# Patient Record
Sex: Male | Born: 1975 | Hispanic: No | Marital: Married | Smoking: Current some day smoker
Health system: Southern US, Community
[De-identification: ages and names within clinical notes are randomized; demographics above are authoritative.]

## PROBLEM LIST (undated history)

## (undated) DIAGNOSIS — J309 Allergic rhinitis, unspecified: Secondary | ICD-10-CM

## (undated) DIAGNOSIS — J3089 Other allergic rhinitis: Secondary | ICD-10-CM

## (undated) DIAGNOSIS — E785 Hyperlipidemia, unspecified: Secondary | ICD-10-CM

## (undated) HISTORY — DX: Hyperlipidemia, unspecified: E78.5

## (undated) HISTORY — PX: NO PAST SURGERIES: SHX2092

## (undated) HISTORY — DX: Other allergic rhinitis: J30.89

## (undated) HISTORY — DX: Allergic rhinitis, unspecified: J30.9

---

## 2001-08-23 ENCOUNTER — Encounter: Payer: Self-pay | Admitting: Family Medicine

## 2001-08-23 ENCOUNTER — Ambulatory Visit (HOSPITAL_COMMUNITY): Admission: RE | Admit: 2001-08-23 | Discharge: 2001-08-23 | Payer: Self-pay | Admitting: Family Medicine

## 2015-05-21 ENCOUNTER — Ambulatory Visit (INDEPENDENT_AMBULATORY_CARE_PROVIDER_SITE_OTHER): Payer: Self-pay | Admitting: Allergy and Immunology

## 2015-05-21 ENCOUNTER — Encounter: Payer: Self-pay | Admitting: Allergy and Immunology

## 2015-05-21 VITALS — BP 118/80 | HR 80 | Resp 16

## 2015-05-21 DIAGNOSIS — L255 Unspecified contact dermatitis due to plants, except food: Secondary | ICD-10-CM

## 2015-05-21 DIAGNOSIS — J309 Allergic rhinitis, unspecified: Secondary | ICD-10-CM

## 2015-05-21 DIAGNOSIS — H1010 Acute atopic conjunctivitis, unspecified eye: Secondary | ICD-10-CM | POA: Insufficient documentation

## 2015-05-21 MED ORDER — HALOBETASOL PROPIONATE 0.05 % EX CREA: TOPICAL_CREAM | Freq: Two times a day (BID) | CUTANEOUS | Status: DC

## 2015-05-21 MED ORDER — METHYLPREDNISOLONE ACETATE 80 MG/ML IJ SUSP
80.0000 mg | Freq: Once | INTRAMUSCULAR | Status: AC
  Administered 2015-05-21: 80 mg via INTRAMUSCULAR

## 2015-05-21 MED ORDER — MONTELUKAST SODIUM 10 MG PO TABS: 10.0000 mg | ORAL_TABLET | Freq: Every day | ORAL | Status: DC

## 2015-05-21 NOTE — Progress Notes (Signed)
[facility] [place]  Follow-up Note    Subjective:   Timothy MannanJuan Russell is a 39 y.o. male who returns to the [facility] in re-evaluation of the following:  HPI Comments: Timothy Russell returns to this clinic noting that his allergies are under excellent control with his current medical therapy which includes a combination of over-the-counter nasal steroid, montelukast, and over-the-counter antihistamine. She uses this plan during the spring and fall. He is very pleased with the response he receives with this plan.  Timothy Russell has developed a very significant dermatitis on his body. This apparently happened after he spent time cleaning up Timothy Russell and using a chipper. His infected his left arm significantly and the rest of his body to some degree. It is intensely itchy. It is oozing. He has no associated systemic or constitutional symptoms. There has been no fever. He has tried some over-the-counter remedies which have not worked.    Current outpatient prescriptions:  .  diphenhydrAMINE (BENADRYL) 2 % cream, Apply topically at bedtime as needed for itching., Disp: , Rfl:  .  cetirizine (ZYRTEC) 10 MG tablet, Take 10 mg by mouth daily., Disp: , Rfl:  .  Ketotifen Fumarate (ALAWAY OP), Apply to eye., Disp: , Rfl:  .  montelukast (SINGULAIR) 10 MG tablet, Take 10 mg by mouth at bedtime., Disp: , Rfl:  .  Triamcinolone Acetonide (NASACORT AQ NA), Place 2 sprays into the nose daily., Disp: , Rfl:   Current facility-administered medications:  .  methylPREDNISolone acetate (DEPO-MEDROL) injection 80 mg, 80 mg, Intramuscular, Once, Jessica PriestEric J Bransen Fassnacht, MD  Meds ordered this encounter  Medications  . methylPREDNISolone acetate (DEPO-MEDROL) injection 80 mg    Sig:     No past medical history on file.  Past Surgical History  Procedure Laterality Date  . No past surgeries      No Known Allergies  Review of Systems  Constitutional: Negative for  fever and chills.  HENT: Negative for congestion, ear discharge, ear pain, hearing loss, nosebleeds, sore throat and tinnitus.   Eyes: Negative for blurred vision, pain, discharge and redness.  Respiratory: Negative for cough, hemoptysis, sputum production, shortness of breath, wheezing and stridor.   Gastrointestinal: Negative for heartburn, nausea, vomiting and abdominal pain.  Musculoskeletal: Negative for myalgias and joint pain.  Skin: Positive for itching and rash.  Neurological: Negative for headaches.     Objective:   Filed Vitals:   05/21/15 1007  BP: 118/80  Pulse: 80  Resp: 16    Physical Exam  Constitutional: He is well-developed, well-nourished, and in no distress. No distress.  HENT:  Head: Normocephalic and atraumatic. Head is without right periorbital erythema and without left periorbital erythema.  Right Ear: Tympanic membrane, external ear and ear canal normal. No drainage. No foreign bodies. Tympanic membrane is not injected, not scarred, not perforated, not erythematous, not retracted and not bulging. No middle ear effusion.  Left Ear: Tympanic membrane, external ear and ear canal normal. No drainage. No foreign bodies. Tympanic membrane is not injected, not scarred, not perforated, not erythematous, not retracted and not bulging.  No middle ear effusion.  Nose: Nose normal. No mucosal edema, rhinorrhea, nose lacerations, sinus tenderness, nasal deformity, septal deviation or nasal septal hematoma. No epistaxis.  Mouth/Throat: Oropharynx is clear and moist and mucous membranes are normal. No oropharyngeal exudate, posterior oropharyngeal edema, posterior oropharyngeal erythema or tonsillar abscesses.  Eyes: Conjunctivae and lids are normal. Pupils are equal, round, and reactive  to light. Right eye exhibits no discharge and no exudate. No foreign body present in the right eye. Left eye exhibits no discharge and no exudate. No foreign body present in the left eye. Right  conjunctiva is not injected. Right conjunctiva has no hemorrhage. Left conjunctiva is not injected. Left conjunctiva has no hemorrhage. No scleral icterus.  Neck: No tracheal tenderness present. No tracheal deviation present. No thyromegaly present.  Cardiovascular: Normal rate, regular rhythm, S1 normal, S2 normal and normal heart sounds.  Exam reveals no gallop and no friction rub.   No murmur heard. Pulmonary/Chest: Effort normal. No stridor. No respiratory distress. He has no wheezes. He has no rhonchi. He has no rales. He exhibits no tenderness.  Musculoskeletal: He exhibits no edema or tenderness.  Lymphadenopathy:    He has no cervical adenopathy.  Skin: Rash noted. No purpura noted. Rash is not macular, not maculopapular, not nodular, not pustular, not vesicular and not urticarial. He is not diaphoretic. There is erythema. No cyanosis. No pallor. Nails show no clubbing.  Diffuse papular vesicular eruption across his abdomen and extremities. He had very severe involvement of his left forearm with very large vesicles. There was some surrounding erythema. The material oozing from the vesicles was clear fluid.  Psychiatric: Mood, affect and judgment normal.    Diagnostics: none     .    Assessment and Plan:   1. Rhus dermatitis   2. Allergic rhinoconjunctivitis        1. Depomedrol 80 mg IM today  2. Prednisone  3 tablets on time a day for 5 days, then 2 tablets one time a day for 5 days, then one tablet one time a day for 5 days.  3. Ultravate cream ointment (which ever is cheaper) apply to affected area twice a day.  4. Use zyrtec  one time a day for itchiness  5. Use OTC Rhinocort one spray each nostril one time per day during Spring / fall (coupon)  6. Continue Montelukast  one tablet one time per day during spring / fall.  7. When better, get a flu vaccine.  8. Return in 1 year or earlier if problem.  I will assume that Dock will do well with a for  mentioned therapy and he will follow up in this clinic 3 does not do well. We will keep him on a prolonged prednisone taper in the hope of averting a id reaction. Otherwise, he will continue therapy for his allergic rhinoconjunctivitis and I will see him back in this clinic in 1 year or earlier if a problem    Laurette Schimke, MD [facility]

## 2015-05-21 NOTE — Patient Instructions (Signed)
  1. Depomedrol 80 mg IM today  2. Prednisone 10mg  3 tablets on time a day for 5 days, then 2 tablets one time a day for 5 days, then one tablet one time a day for 5 days.  3. Ultravate cream ointment (which ever is cheaper) apply to affected area twice a day.  4. Use zyrtec 10mg  one time a day for itchiness  5. Use OTC Rhinocort one spray each nostril one time per day during Spring / fall (coupon)  6. Continue Montelukast 10mg  one tablet one time per day during spring / fall.  7. When better, get a flu vaccine.  8. Return in 1 year or earlier if problem.

## 2016-06-19 ENCOUNTER — Other Ambulatory Visit: Payer: Self-pay | Admitting: Allergy and Immunology

## 2016-07-24 ENCOUNTER — Other Ambulatory Visit: Payer: Self-pay | Admitting: Allergy and Immunology

## 2016-08-01 ENCOUNTER — Other Ambulatory Visit: Payer: Self-pay | Admitting: Allergy and Immunology

## 2016-11-30 ENCOUNTER — Ambulatory Visit (INDEPENDENT_AMBULATORY_CARE_PROVIDER_SITE_OTHER): Payer: Self-pay | Admitting: Allergy and Immunology

## 2016-11-30 ENCOUNTER — Encounter: Payer: Self-pay | Admitting: Allergy and Immunology

## 2016-11-30 VITALS — BP 110/70 | HR 60 | Resp 18

## 2016-11-30 DIAGNOSIS — J3089 Other allergic rhinitis: Secondary | ICD-10-CM

## 2016-11-30 DIAGNOSIS — H1045 Other chronic allergic conjunctivitis: Secondary | ICD-10-CM

## 2016-11-30 DIAGNOSIS — H1010 Acute atopic conjunctivitis, unspecified eye: Secondary | ICD-10-CM

## 2016-11-30 MED ORDER — MONTELUKAST SODIUM 10 MG PO TABS: 10.0000 mg | ORAL_TABLET | Freq: Every day | ORAL | 11 refills | Status: DC

## 2016-11-30 MED ORDER — METHYLPREDNISOLONE ACETATE 80 MG/ML IJ SUSP
80.0000 mg | Freq: Once | INTRAMUSCULAR | Status: AC
  Administered 2016-11-30: 80 mg via INTRAMUSCULAR

## 2016-11-30 NOTE — Progress Notes (Signed)
Follow-up Note  Referring Provider: No ref. provider found Primary Provider: Sandre Kitty, PA-C Date of Office Visit: 11/30/2016  Subjective:   Timothy Russell (DOB: 1976-05-27) is a 41 y.o. male who returns to the [facility] on 11/30/2016 in re-evaluation of the following:  HPI: Timothy Russell returns to this clinic in reevaluation of his allergic rhinoconjunctivitis. I last saw him in this clinic October 2016.  He does relatively well while consistently using his medical therapy which at this point in time include some form of nasal steroid and a leukotriene modifier and a antihistamine but since the spring has arrived he has had more problems with nasal congestion and sneezing and itchy red watery eyes even in the face of utilizing his medications.  Overall he is very satisfied with the response that he receives with medical treatment other than what occurs during the spring time.  Allergies as of 11/30/2016   No Known Allergies     Medication List      ALAWAY OP Apply to eye.   cetirizine 10 MG tablet Commonly known as:  ZYRTEC Take 10 mg by mouth daily.   FISH OIL PO Take by mouth.   montelukast 10 MG tablet Commonly known as:  SINGULAIR Take 1 tablet (10 mg total) by mouth at bedtime.   NASACORT AQ NA Place 2 sprays into the nose daily.   omeprazole 20 MG capsule Commonly known as:  PRILOSEC TAKE 1 CAPSULE(S) EVERY DAY BY ORAL ROUTE IN THE MORNING.   VITAMIN C PO Take by mouth.       Past Medical History:  Diagnosis Date  . Allergic rhinitis     Past Surgical History:  Procedure Laterality Date  . NO PAST SURGERIES      Review of systems negative except as noted in HPI / PMHx or noted below:  Review of Systems  Constitutional: Negative.   HENT: Negative.   Eyes: Negative.   Respiratory: Negative.   Cardiovascular: Negative.   Gastrointestinal: Negative.   Genitourinary: Negative.   Musculoskeletal: Negative.   Skin:  Negative.   Neurological: Negative.   Endo/Heme/Allergies: Negative.   Psychiatric/Behavioral: Negative.      Objective:   Vitals:   11/30/16 1052  BP: 110/70  Pulse: 60  Resp: 18          Physical Exam  Constitutional: He is well-developed, well-nourished, and in no distress.  Allergic shiners  HENT:  Head: Normocephalic.  Right Ear: Tympanic membrane, external ear and ear canal normal.  Left Ear: Tympanic membrane, external ear and ear canal normal.  Nose: Mucosal edema present. No rhinorrhea.  Mouth/Throat: Uvula is midline, oropharynx is clear and moist and mucous membranes are normal. No oropharyngeal exudate.  Eyes: Right conjunctiva is injected. Left conjunctiva is injected.  Neck: Trachea normal. No tracheal tenderness present. No tracheal deviation present. No thyromegaly present.  Cardiovascular: Normal rate, regular rhythm, S1 normal, S2 normal and normal heart sounds.   No murmur heard. Pulmonary/Chest: Breath sounds normal. No stridor. No respiratory distress. He has no wheezes. He has no rales.  Musculoskeletal: He exhibits no edema.  Lymphadenopathy:       Head (right side): No tonsillar adenopathy present.       Head (left side): No tonsillar adenopathy present.    He has no cervical adenopathy.  Neurological: He is alert. Gait normal.  Skin: No rash noted. He is not diaphoretic. No erythema. Nails show no clubbing.  Psychiatric: Mood and affect  normal.    Diagnostics: none   Assessment and Plan:   1. Other allergic rhinitis   2. Seasonal allergic conjunctivitis     1. Depomedrol 80 mg IM today  2. Use zyrtec  one time a day    3. Use OTC Rhinocort / Nasacort one spray each nostril one time per day during Spring / fall    4. Continue Montelukast  one tablet one time per day during spring / fall.  5. Return in 1 year or earlier if problem.  I have given Timothy Russell a systemic steroid today to help with his springtime flare of his allergic  rhinoconjunctivitis and he will continue to use medical therapy directed against respiratory tract and conjunctival inflammation as noted above and I will assume that he will do well with this plan and see him back in this clinic in 1 year or earlier if there is a problem.  Laurette Schimke, MD Allergy / Immunology [facility]

## 2016-11-30 NOTE — Patient Instructions (Signed)
  1. Depomedrol 80 mg IM today  2. Use zyrtec  one time a day    3. Use OTC Rhinocort / Nasacort one spray each nostril one time per day during Spring / fall    4. Continue Montelukast  one tablet one time per day during spring / fall.  5. Return in 1 year or earlier if problem.

## 2017-09-22 ENCOUNTER — Other Ambulatory Visit: Payer: Self-pay | Admitting: Allergy and Immunology

## 2017-12-15 ENCOUNTER — Other Ambulatory Visit: Payer: Self-pay | Admitting: Allergy and Immunology

## 2018-10-31 ENCOUNTER — Other Ambulatory Visit: Payer: Self-pay

## 2018-10-31 ENCOUNTER — Encounter: Payer: Self-pay | Admitting: Allergy and Immunology

## 2018-10-31 ENCOUNTER — Ambulatory Visit (INDEPENDENT_AMBULATORY_CARE_PROVIDER_SITE_OTHER): Payer: Self-pay | Admitting: Allergy and Immunology

## 2018-10-31 VITALS — BP 120/70 | HR 76 | Resp 16 | Ht 69.0 in | Wt 186.0 lb

## 2018-10-31 DIAGNOSIS — J3089 Other allergic rhinitis: Secondary | ICD-10-CM

## 2018-10-31 DIAGNOSIS — H1010 Acute atopic conjunctivitis, unspecified eye: Secondary | ICD-10-CM

## 2018-10-31 MED ORDER — MONTELUKAST SODIUM 10 MG PO TABS: ORAL_TABLET | ORAL | 11 refills | Status: DC

## 2018-10-31 MED ORDER — METHYLPREDNISOLONE ACETATE 80 MG/ML IJ SUSP
80.0000 mg | Freq: Once | INTRAMUSCULAR | Status: AC
  Administered 2018-10-31: 80 mg via INTRAMUSCULAR

## 2018-10-31 NOTE — Patient Instructions (Addendum)
  1. Depomedrol 80 mg IM today  2. Continue Montelukast 10mg  one tablet one time per day   3. Use zyrtec 10mg  one time a day if needed  4. Use OTC Rhinocort / Nasacort one spray each nostril one time if needed  5. Use Alaway eyedrops 1 drop each eye twice a day if needed  6. Return in 1 year or earlier if problem.

## 2018-10-31 NOTE — Progress Notes (Signed)
Tim[facility] Russell - [place] [facility] [place] [facility] [place] [facility] [place]   Follow-up Note  Referring Provider: Marya Russell Primary Provider: Marya Russell Date of Office Visit: 10/31/2018  Subjective:   Timothy Russell (DOB: 16-Jun-1976) is a 43 y.o. male who returns to the [facility] on 10/31/2018 in re-evaluation of the following:  HPI: Timothy Russell returns to this clinic in reevaluation of allergic rhinoconjunctivitis.  His last visit to this clinic was 30 Nov 2016.  Over the course of the past week if not 2 he has had lots of problems with itchy red watery eyes and nasal congestion and sneezing even in the face of utilizing his montelukast and Zyrtec and a nasal steroid.  He has not had any fever or ugly nasal discharge or other respiratory tract symptoms.  Allergies as of 10/31/2018   No Known Allergies     Medication List      ALAWAY OP Apply to eye.   cetirizine 10 MG tablet Commonly known as:  ZYRTEC Take 10 mg by mouth daily.   montelukast 10 MG tablet Commonly known as:  SINGULAIR TAKE 1 TABLET BY MOUTH EVERYDAY AT BEDTIME   NASACORT AQ NA Place 2 sprays into the nose daily.   VITAMIN C PO Take by mouth.       Past Medical History:  Diagnosis Date  . Allergic rhinitis     Past Surgical History:  Procedure Laterality Date  . NO PAST SURGERIES      Review of systems negative except as noted in HPI / PMHx or noted below:  Review of Systems  Constitutional: Negative.   HENT: Negative.   Eyes: Negative.   Respiratory: Negative.   Cardiovascular: Negative.   Gastrointestinal: Negative.   Genitourinary: Negative.   Musculoskeletal: Negative.   Skin: Negative.   Neurological: Negative.   Endo/Heme/Allergies: Negative.   Psychiatric/Behavioral: Negative.      Objective:   Vitals:   10/31/18 1552  BP: 120/70  Pulse: 76  Resp: 16   Height: 5\' 9"  (175.3 cm)  Weight: 186 lb (84.4 kg)   Physical Exam Constitutional:       Appearance: He is not diaphoretic.     Comments: Allergic shiners  HENT:     Head: Normocephalic.     Right Ear: Tympanic membrane, ear canal and external ear normal.     Left Ear: Tympanic membrane, ear canal and external ear normal.     Nose: Mucosal edema (Erythematous) present. No rhinorrhea.     Mouth/Throat:     Pharynx: Uvula midline. No oropharyngeal exudate.  Eyes:     Conjunctiva/sclera:     Right eye: Right conjunctiva is injected.     Left eye: Left conjunctiva is injected.  Neck:     Thyroid: No thyromegaly.     Trachea: Trachea normal. No tracheal tenderness or tracheal deviation.  Cardiovascular:     Rate and Rhythm: Normal rate and regular rhythm.     Heart sounds: Normal heart sounds, S1 normal and S2 normal. No murmur.  Pulmonary:     Effort: No respiratory distress.     Breath sounds: Normal breath sounds. No stridor. No wheezing or rales.  Lymphadenopathy:     Head:     Right side of head: No tonsillar adenopathy.     Left side of head: No tonsillar adenopathy.     Cervical: No cervical adenopathy.  Skin:    Findings: No erythema or rash.  Nails: There is no clubbing.   Neurological:     Mental Status: He is alert.     Diagnostics: none  Assessment and Plan:   1. Other allergic rhinitis   2. Seasonal allergic conjunctivitis     1. Depomedrol 80 mg IM today  2. Continue Montelukast 10mg  one tablet one time per day   3. Use zyrtec 10mg  one time a day if needed  4. Use OTC Rhinocort / Nasacort one spray each nostril one time if needed  5. Use Alaway eyedrops 1 drop each eye twice a day if needed  6. Return in 1 year or earlier if problem.  Delshaun appears to have a very significant flare of his atopic respiratory and conjunctival disease that hopefully will be helped by his current plan and a dose of systemic steroids administered today.  He will keep in contact with me noting his response as he moves forward.  Timothy Schimke, MD Allergy /  Immunology [facility]

## 2018-11-01 ENCOUNTER — Encounter: Payer: Self-pay | Admitting: Allergy and Immunology

## 2019-01-15 ENCOUNTER — Encounter (HOSPITAL_BASED_OUTPATIENT_CLINIC_OR_DEPARTMENT_OTHER): Payer: Self-pay | Admitting: *Deleted

## 2019-01-15 ENCOUNTER — Emergency Department (HOSPITAL_BASED_OUTPATIENT_CLINIC_OR_DEPARTMENT_OTHER)
Admission: EM | Admit: 2019-01-15 | Discharge: 2019-01-15 | Disposition: A | Discharge status: Home / Self Care | Payer: Self-pay | Attending: Emergency Medicine | Admitting: Emergency Medicine

## 2019-01-15 ENCOUNTER — Emergency Department (HOSPITAL_BASED_OUTPATIENT_CLINIC_OR_DEPARTMENT_OTHER): Payer: Self-pay

## 2019-01-15 ENCOUNTER — Other Ambulatory Visit: Payer: Self-pay

## 2019-01-15 DIAGNOSIS — S2232XA Fracture of one rib, left side, initial encounter for closed fracture: Secondary | ICD-10-CM | POA: Insufficient documentation

## 2019-01-15 DIAGNOSIS — W19XXXA Unspecified fall, initial encounter: Secondary | ICD-10-CM

## 2019-01-15 DIAGNOSIS — Z79899 Other long term (current) drug therapy: Secondary | ICD-10-CM | POA: Insufficient documentation

## 2019-01-15 DIAGNOSIS — W01198A Fall on same level from slipping, tripping and stumbling with subsequent striking against other object, initial encounter: Secondary | ICD-10-CM | POA: Insufficient documentation

## 2019-01-15 DIAGNOSIS — Y9289 Other specified places as the place of occurrence of the external cause: Secondary | ICD-10-CM | POA: Insufficient documentation

## 2019-01-15 DIAGNOSIS — F17200 Nicotine dependence, unspecified, uncomplicated: Secondary | ICD-10-CM | POA: Insufficient documentation

## 2019-01-15 DIAGNOSIS — Y93H9 Activity, other involving exterior property and land maintenance, building and construction: Secondary | ICD-10-CM | POA: Insufficient documentation

## 2019-01-15 DIAGNOSIS — Y999 Unspecified external cause status: Secondary | ICD-10-CM | POA: Insufficient documentation

## 2019-01-15 MED ORDER — IBUPROFEN 600 MG PO TABS: 600.0000 mg | ORAL_TABLET | Freq: Three times a day (TID) | ORAL | 0 refills | Status: DC | PRN

## 2019-01-15 NOTE — ED Triage Notes (Signed)
3 days ago his foot went into a hole causing him to fall. Injury to his left ribs.

## 2019-01-15 NOTE — ED Notes (Signed)
ED Provider at bedside. 

## 2019-01-15 NOTE — Discharge Instructions (Signed)
1.  Return to the emergency department if you become short of breath, pain is suddenly worsening, you develop a fever, coughing up blood or mucus.

## 2019-01-15 NOTE — ED Provider Notes (Signed)
[place] EMERGENCY DEPARTMENT Provider Note   CSN: [id number] Arrival date & time: 01/15/19  1417     History   Chief Complaint Chief Complaint  Patient presents with  . Fall  . Rib Injury    HPI Timothy Russell is a 43 y.o. male.     HPI 3 days ago the patient was painting in a squatted position and lost his balance falling on his left side onto a board.  He reports it hurt but he continued with usual activities.  Ports it got painful with twisting, bending, laughing.  No shortness of breath.  No fever no cough.  Abdominal pain.  No blood in the urine.  No other injury.  Today the patient tried Advil and feels better. Past Medical History:  Diagnosis Date  . Allergic rhinitis     Patient Active Problem List   Diagnosis Date Noted  . Allergic rhinoconjunctivitis 05/21/2015    Past Surgical History:  Procedure Laterality Date  . NO PAST SURGERIES          Home Medications    Prior to Admission medications   Medication Sig Start Date End Date Taking? Authorizing Provider  cetirizine (ZYRTEC) 10 MG tablet Take 10 mg by mouth daily.   Yes [provider]  montelukast (SINGULAIR) 10 MG tablet TAKE 1 TABLET BY MOUTH EVERYDAY AT BEDTIME 10/31/18  Yes Kozlow, Donnamarie Poag, MD  Triamcinolone Acetonide (NASACORT AQ NA) Place 2 sprays into the nose daily.   Yes [provider]  Ascorbic Acid (VITAMIN C PO) Take by mouth.    [provider]  ibuprofen (ADVIL) 600 MG tablet Take 1 tablet (600 mg total) by mouth every 8 (eight) hours as needed. 01/15/19   Charlesetta Shanks, MD  Ketotifen Fumarate (ALAWAY OP) Apply to eye.    [provider]    Family History Family History  Problem Relation Age of Onset  . Allergic rhinitis Neg Hx   . Angioedema Neg Hx   . Asthma Neg Hx   . Eczema Neg Hx   . Urticaria Neg Hx   . Immunodeficiency Neg Hx   . Atopy Neg Hx     Social History Social History   Tobacco Use  . Smoking status:  Current Some Day Smoker  . Smokeless tobacco: Never Used  Substance Use Topics  . Alcohol use: Not Currently    Alcohol/week: 0.0 standard drinks  . Drug use: Never     Allergies   Patient has no known allergies.   Review of Systems Review of Systems 10 Systems reviewed and are negative for acute change except as noted in the HPI.   Physical Exam Updated Vital Signs BP 129/90   Pulse 73   Temp 97.8 F (36.6 C) (Oral)   Resp 16   Ht 5\' 7"  (1.702 m)   Wt 86.9 kg   SpO2 99%   BMI 30.01 kg/m   Physical Exam Constitutional:      Appearance: He is well-developed.  HENT:     Head: Normocephalic and atraumatic.  Eyes:     Extraocular Movements: Extraocular movements intact.  Neck:     Musculoskeletal: Neck supple.  Cardiovascular:     Rate and Rhythm: Normal rate and regular rhythm.     Heart sounds: Normal heart sounds.  Pulmonary:     Effort: Pulmonary effort is normal.     Breath sounds: Normal breath sounds.     Comments: Focal chest tenderness at lateral lower chest  wall.  No contusion abrasion or crepitus. Chest:     Chest wall: Tenderness present.  Abdominal:     General: Bowel sounds are normal. There is no distension.     Palpations: Abdomen is soft.     Tenderness: There is no abdominal tenderness.  Musculoskeletal: Normal range of motion.  Skin:    General: Skin is warm and dry.  Neurological:     Mental Status: He is alert and oriented to person, place, and time.     GCS: GCS eye subscore is 4. GCS verbal subscore is 5. GCS motor subscore is 6.     Coordination: Coordination normal.      ED Treatments / Results  Labs (all labs ordered are listed, but only abnormal results are displayed) Labs Reviewed - No data to display  EKG    Radiology Dg Ribs Unilateral W/chest Left  Result Date: 01/15/2019 CLINICAL DATA:  Lower anterior left rib pain. EXAM: LEFT RIBS AND CHEST - 3+ VIEW COMPARISON:  None. FINDINGS: Minimally displaced left lateral  ninth rib fracture. No evidence of pneumothorax. Normal appearance of the heart, and mediastinal contours. Mild left lung base atelectasis. IMPRESSION: Minimally displaced left lateral ninth rib fracture. Electronically Signed   By: Ted Mcalpineobrinka  Dimitrova M.D.   On: 01/15/2019 15:18    Procedures Procedures (including critical care time)  Medications Ordered in ED Medications - No data to display   Initial Impression / Assessment and Plan / ED Course  I have reviewed the triage vital signs and the nursing notes.  Pertinent labs & imaging results that were available during my care of the patient were reviewed by me and considered in my medical decision making (see chart for details).        Patient has isolated rib fracture.  Fall was actually from a low position and falling onto an object.  Do not suspect other associated injury.  Patient has gotten relief at home with Advil.  Management and return precautions reviewed.  Final Clinical Impressions(s) / ED Diagnoses   Final diagnoses:  Closed fracture of one rib of left side, initial encounter    ED Discharge Orders         Ordered    ibuprofen (ADVIL) 600 MG tablet  Every 8 hours PRN     01/15/19 1532           Arby BarrettePfeiffer, Jonn Chaikin, MD 01/15/19 1535

## 2019-01-16 ENCOUNTER — Telehealth: Payer: Self-pay | Admitting: General Practice

## 2019-01-16 NOTE — Telephone Encounter (Signed)
Pt needed a new patient appt for rib pain he's been experiencing since 6/12. Urgent Care was recommended.

## 2019-10-21 ENCOUNTER — Other Ambulatory Visit: Payer: Self-pay | Admitting: Allergy and Immunology

## 2019-10-22 NOTE — Telephone Encounter (Signed)
Courtesy refill  

## 2019-11-15 ENCOUNTER — Other Ambulatory Visit: Payer: Self-pay | Admitting: Allergy and Immunology

## 2019-11-20 ENCOUNTER — Ambulatory Visit (INDEPENDENT_AMBULATORY_CARE_PROVIDER_SITE_OTHER): Payer: Self-pay | Admitting: Allergy and Immunology

## 2019-11-20 ENCOUNTER — Encounter: Payer: Self-pay | Admitting: Allergy and Immunology

## 2019-11-20 ENCOUNTER — Other Ambulatory Visit: Payer: Self-pay

## 2019-11-20 VITALS — BP 126/82 | HR 82 | Temp 98.1°F | Resp 18 | Ht 68.0 in | Wt 193.0 lb

## 2019-11-20 DIAGNOSIS — J3089 Other allergic rhinitis: Secondary | ICD-10-CM

## 2019-11-20 DIAGNOSIS — J301 Allergic rhinitis due to pollen: Secondary | ICD-10-CM

## 2019-11-20 DIAGNOSIS — H1010 Acute atopic conjunctivitis, unspecified eye: Secondary | ICD-10-CM

## 2019-11-20 MED ORDER — METHYLPREDNISOLONE ACETATE 80 MG/ML IJ SUSP
80.0000 mg | Freq: Once | INTRAMUSCULAR | Status: AC
  Administered 2019-11-20: 80 mg via INTRAMUSCULAR

## 2019-11-20 NOTE — Progress Notes (Signed)
[place] - [place] - [place] - [place] - [place]   Follow-up Note  Referring Provider: No ref. provider found Primary Provider: Patient, No Pcp Per Date of Office Visit: 11/20/2019  Subjective:   Timothy Russell (DOB: November 10, 1975) is a 44 y.o. male who [facility] in re-evaluation of the following:  HPI: Timothy Russell returns to this clinic in reevaluation of allergic rhinoconjunctivitis.  I last saw him in this clinic on 31 October 2018.  While consistently using montelukast and Zyrtec and Nasacort his upper airway issue has done relatively well this spring.  But his eyes are totally torn up with redness and itching and he can stop rubbing his eyes even though he uses Alaway eyedrops every day.  This is his usual course of events throughout the spring.  The rest of the year he does very well while consistently using his medications.  He has received 2 Covid vaccinations.  Allergies as of 11/20/2019   No Known Allergies     Medication List      ALAWAY OP Apply to eye.   cetirizine 10 MG tablet Commonly known as: ZYRTEC Take 10 mg by mouth daily.   ibuprofen 600 MG tablet Commonly known as: ADVIL Take 1 tablet (600 mg total) by mouth every 8 (eight) hours as needed.   montelukast 10 MG tablet Commonly known as: SINGULAIR TAKE 1 TABLET BY MOUTH EVERYDAY AT BEDTIME   NASACORT AQ NA Place 2 sprays into the nose daily.   VITAMIN C PO Take by mouth.       Past Medical History:  Diagnosis Date  . Allergic rhinitis     Past Surgical History:  Procedure Laterality Date  . NO PAST SURGERIES      Review of systems negative except as noted in HPI / PMHx or noted below:  Review of Systems  Constitutional: Negative.   HENT: Negative.   Eyes: Negative.   Respiratory: Negative.   Cardiovascular: Negative.   Gastrointestinal: Negative.   Genitourinary: Negative.   Musculoskeletal: Negative.   Skin: Negative.     Neurological: Negative.   Endo/Heme/Allergies: Negative.   Psychiatric/Behavioral: Negative.      Objective:   Vitals:   11/20/19 0855  BP: 126/82  Pulse: 82  Resp: 18  Temp: 98.1 F (36.7 C)  SpO2: 96%   Height: 5\' 8"  (172.7 cm)  Weight: 193 lb (87.5 kg)   Physical Exam Constitutional:      Appearance: He is not diaphoretic.  HENT:     Head: Normocephalic.     Right Ear: Tympanic membrane, ear canal and external ear normal.     Left Ear: Tympanic membrane, ear canal and external ear normal.     Nose: Nose normal. No mucosal edema or rhinorrhea.     Mouth/Throat:     Pharynx: Uvula midline. No oropharyngeal exudate.  Eyes:     Conjunctiva/sclera:     Right eye: Right conjunctiva is injected.     Left eye: Left conjunctiva is injected.  Neck:     Thyroid: No thyromegaly.     Trachea: Trachea normal. No tracheal tenderness or tracheal deviation.  Cardiovascular:     Rate and Rhythm: Normal rate and regular rhythm.     Heart sounds: Normal heart sounds, S1 normal and S2 normal. No murmur.  Pulmonary:     Effort: No respiratory distress.     Breath sounds: Normal breath sounds. No stridor. No wheezing or rales.  Lymphadenopathy:  Head:     Right side of head: No tonsillar adenopathy.     Left side of head: No tonsillar adenopathy.     Cervical: No cervical adenopathy.  Skin:    Findings: No erythema or rash.     Nails: There is no clubbing.  Neurological:     Mental Status: He is alert.     Diagnostics: none  Assessment and Plan:   1. Perennial allergic rhinitis   2. Seasonal allergic rhinitis due to pollen   3. Seasonal allergic conjunctivitis     1. Depomedrol 80 mg IM today  2. Continue Montelukast 10mg  one tablet one time per day   3. Use OTC Zyrtec 10mg  one time a day if needed  4. Use OTC Nasacort one spray each nostril one time if needed  5. Use OTC Pataday 1 drop each eye 1 time a day if needed  6. Return in 1 year or earlier if  problem.  Timothy Russell has had a flareup of his atopic respiratory and conjunctival disease as he progresses through the spring season.  We will treat him with the therapy noted above which includes the use of a systemic steroid.  He will keep in contact with me noting his response to this approach.  Further evaluation and treatment will be based upon his response.  Timothy Katz, MD Allergy / Immunology [place]

## 2019-11-20 NOTE — Patient Instructions (Addendum)
  1. Depomedrol 80 mg IM today  2. Continue Montelukast 10mg one tablet one time per day   3. Use OTC Zyrtec 10mg one time a day if needed  4. Use OTC Nasacort one spray each nostril one time if needed  5. Use OTC Pataday 1 drop each eye 1 time a day if needed  6. Return in 1 year or earlier if problem.   

## 2019-12-10 ENCOUNTER — Other Ambulatory Visit: Payer: Self-pay

## 2019-12-10 ENCOUNTER — Other Ambulatory Visit: Payer: Self-pay | Admitting: Allergy and Immunology

## 2019-12-10 MED ORDER — MONTELUKAST SODIUM 10 MG PO TABS: 10.0000 mg | ORAL_TABLET | Freq: Every day | ORAL | 5 refills | Status: DC

## 2019-12-10 NOTE — Telephone Encounter (Signed)
Montelukast #30 with 5 refills was sent to pharmacy. Patient made aware.

## 2019-12-10 NOTE — Telephone Encounter (Signed)
Patient was seen 11/20/19. His wife called to let us know his Montelukast was never sent in.  [facility].

## 2019-12-25 IMAGING — DX LEFT RIBS AND CHEST - 3+ VIEW
3 series · 3 of 3 positions shown · non-contrast
Comparison: None.

CLINICAL DATA: Lower anterior left rib pain.

EXAM:
LEFT RIBS AND CHEST - 3+ VIEW

[chest pa]
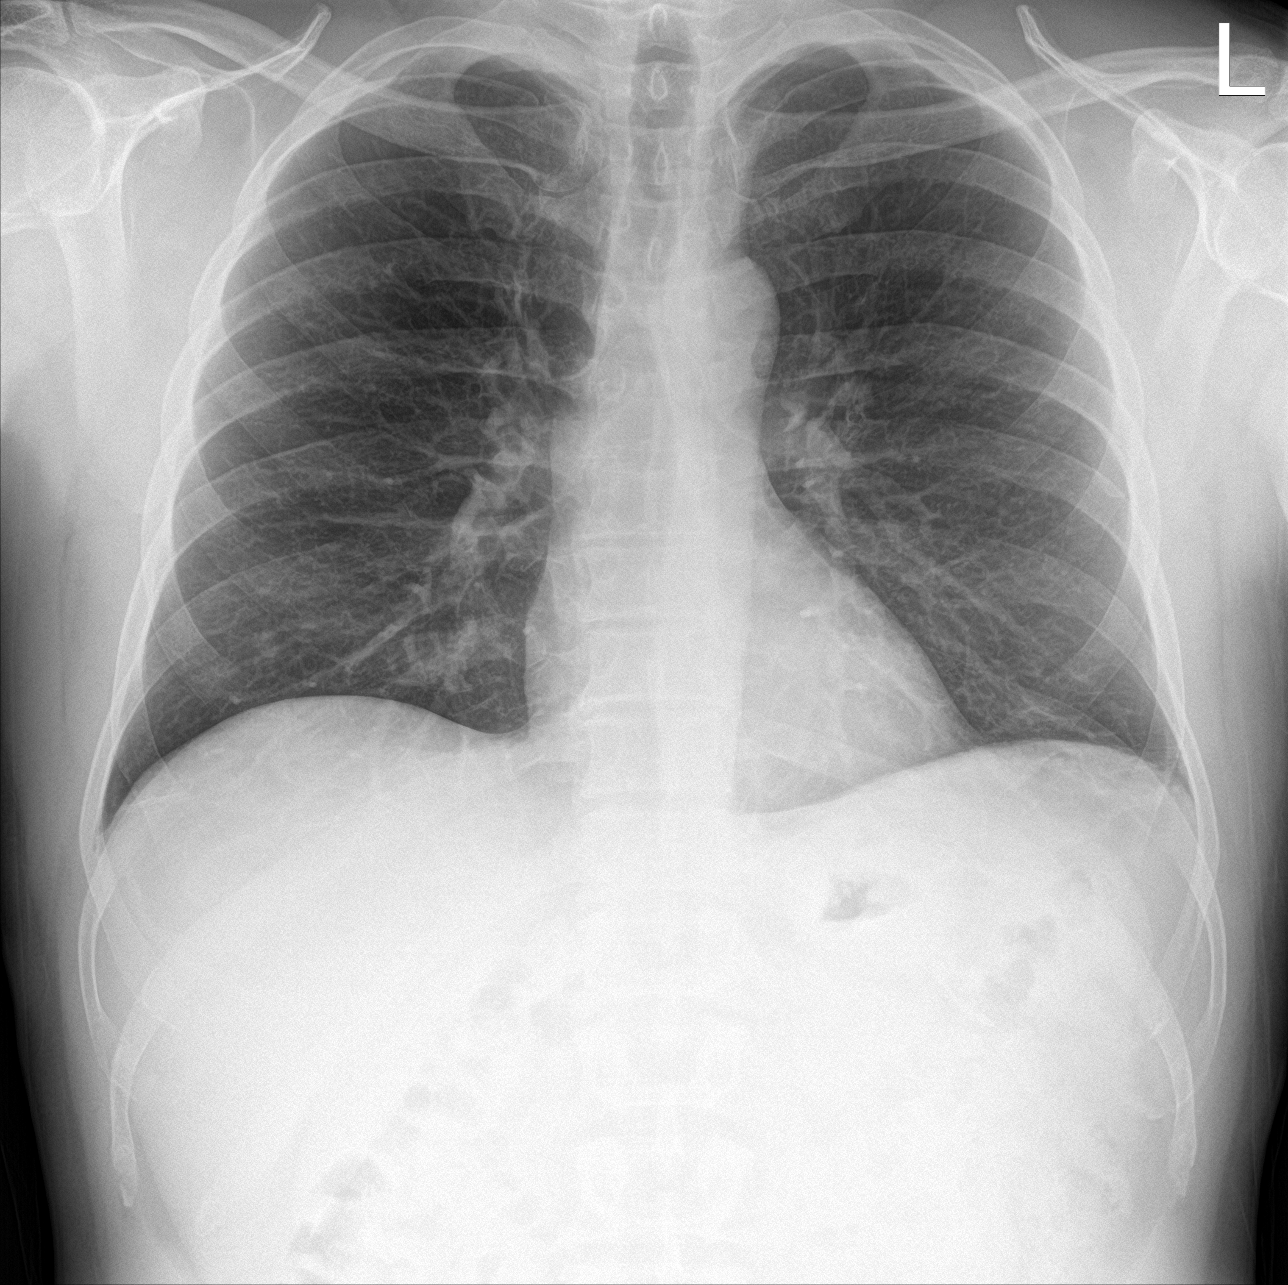

[rib pa]
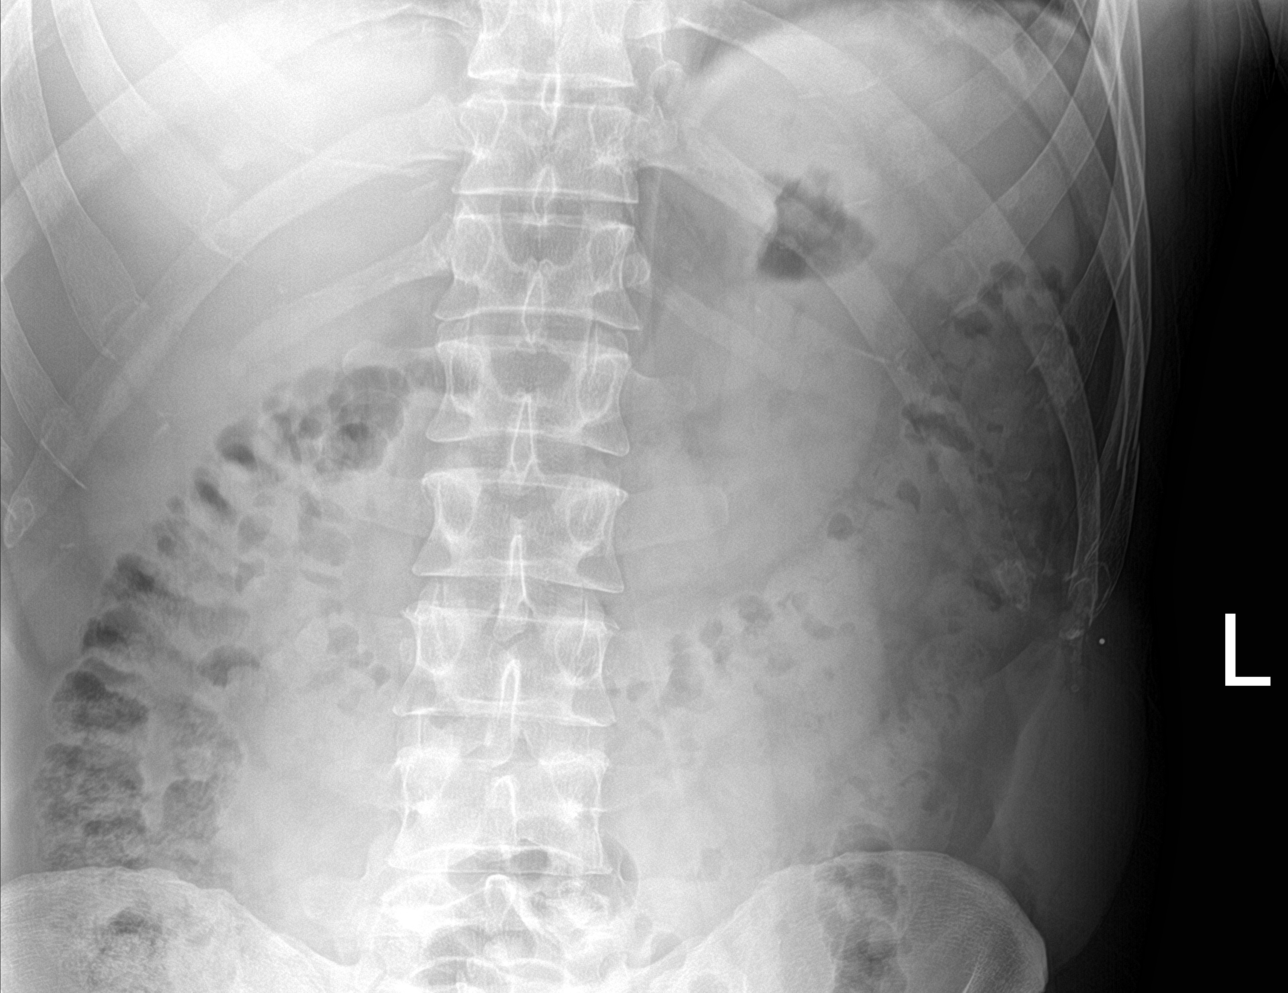

[rib pa obl]
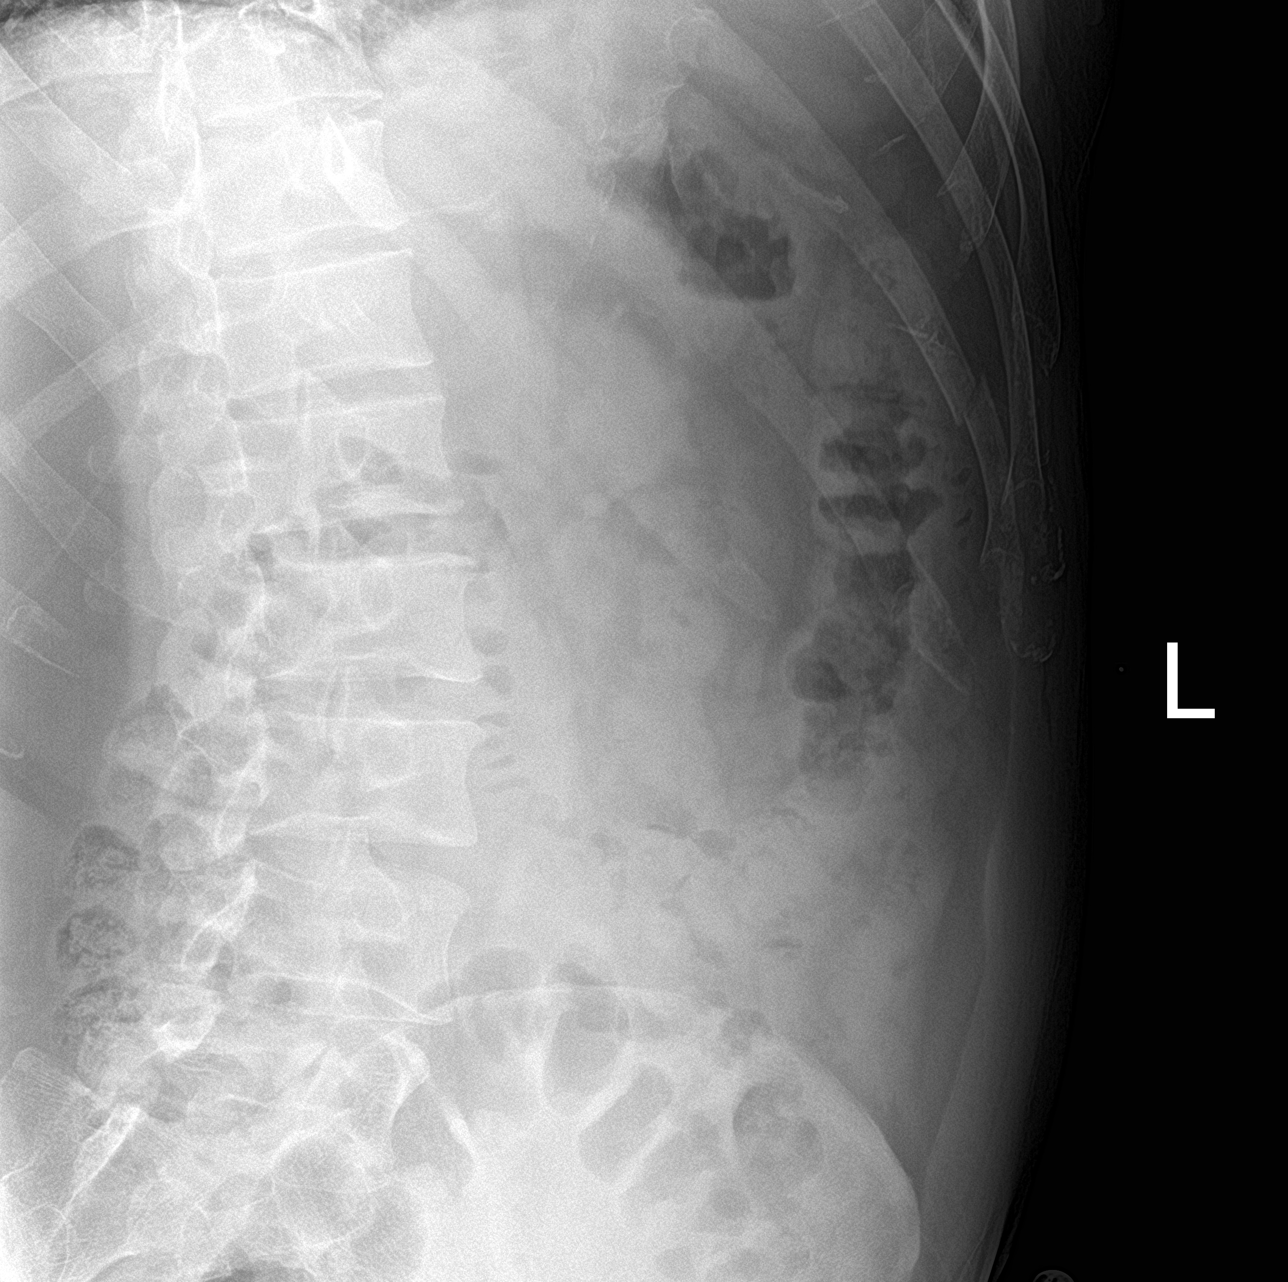

[3 of 3 positions shown; findings below may reference images not displayed]

FINDINGS: Minimally displaced left lateral ninth rib fracture. No evidence of
pneumothorax.

Normal appearance of the heart, and mediastinal contours. Mild left
lung base atelectasis.
IMPRESSION: Minimally displaced left lateral ninth rib fracture.

## 2020-05-07 ENCOUNTER — Encounter: Payer: Self-pay | Admitting: Nurse Practitioner

## 2020-05-07 ENCOUNTER — Other Ambulatory Visit: Payer: Self-pay

## 2020-05-07 ENCOUNTER — Ambulatory Visit: Payer: Self-pay | Attending: Nurse Practitioner | Admitting: Nurse Practitioner

## 2020-05-07 VITALS — Ht 70.0 in | Wt 185.0 lb

## 2020-05-07 DIAGNOSIS — E785 Hyperlipidemia, unspecified: Secondary | ICD-10-CM

## 2020-05-07 DIAGNOSIS — Z1329 Encounter for screening for other suspected endocrine disorder: Secondary | ICD-10-CM

## 2020-05-07 DIAGNOSIS — Z131 Encounter for screening for diabetes mellitus: Secondary | ICD-10-CM

## 2020-05-07 DIAGNOSIS — Z130 Encounter for screening for diseases of the blood and blood-forming organs and certain disorders involving the immune mechanism: Secondary | ICD-10-CM

## 2020-05-07 DIAGNOSIS — Z7689 Persons encountering health services in other specified circumstances: Secondary | ICD-10-CM

## 2020-05-07 DIAGNOSIS — Z13228 Encounter for screening for other metabolic disorders: Secondary | ICD-10-CM

## 2020-05-07 NOTE — Progress Notes (Signed)
Virtual Visit via Telephone Note Due to national recommendations of social distancing due to Numa 19, telehealth visit is felt to be most appropriate for this patient at this time.  I discussed the limitations, risks, security and privacy concerns of performing an evaluation and management service by telephone and the availability of in person appointments. I also discussed with the patient that there may be a patient responsible charge related to this service. The patient expressed understanding and agreed to proceed.    I connected with Timothy Russell on 05/07/20  at   8:50 AM EDT  EDT by telephone and verified that I am speaking with the correct person using two identifiers.   Consent I discussed the limitations, risks, security and privacy concerns of performing an evaluation and management service by telephone and the availability of in person appointments. I also discussed with the patient that there may be a patient responsible charge related to this service. The patient expressed understanding and agreed to proceed.   Location of Patient: Private Residence   Location of Provider: [place] and [place] participating in Telemedicine visit: Geryl Rankins FNP-BC [place]    History of Present Illness: Telemedicine visit for: Establish Care  He has no questions or concerns today. Has not seen a PCP in several years. Notes a history of high cholesterol levels but denies HTN, Thyroid disorder or Diabetes.   Denies chest pain, shortness of breath, palpitations, lightheadedness, dizziness, headaches or BLE edema.   He sees an allergist for his perennial allergies.      Past Medical History:  Diagnosis Date  . Hyperlipidemia   . Perennial allergic rhinitis     Past Surgical History:  Procedure Laterality Date  . NO PAST SURGERIES      Family History  Problem Relation Age of Onset  . Hypertension Mother   .  Allergic rhinitis Neg Hx   . Angioedema Neg Hx   . Asthma Neg Hx   . Eczema Neg Hx   . Urticaria Neg Hx   . Immunodeficiency Neg Hx   . Atopy Neg Hx     Social History   Socioeconomic History  . Marital status: Married    Spouse name: Not on file  . Number of children: Not on file  . Years of education: Not on file  . Highest education level: Not on file  Occupational History  . Not on file  Tobacco Use  . Smoking status: Current Some Day Smoker  . Smokeless tobacco: Never Used  Substance and Sexual Activity  . Alcohol use: Not Currently    Alcohol/week: 0.0 standard drinks  . Drug use: Never  . Sexual activity: Not Currently  Other Topics Concern  . Not on file  Social History Narrative  . Not on file   Social Determinants of Health   Financial Resource Strain:   . Difficulty of Paying Living Expenses: Not on file  Food Insecurity:   . Worried About Charity fundraiser in the Last Year: Not on file  . Ran Out of Food in the Last Year: Not on file  Transportation Needs:   . Lack of Transportation (Medical): Not on file  . Lack of Transportation (Non-Medical): Not on file  Physical Activity:   . Days of Exercise per Week: Not on file  . Minutes of Exercise per Session: Not on file  Stress:   . Feeling of Stress : Not on file  Social  Connections:   . Frequency of Communication with Friends and Family: Not on file  . Frequency of Social Gatherings with Friends and Family: Not on file  . Attends Religious Services: Not on file  . Active Member of Clubs or Organizations: Not on file  . Attends Archivist Meetings: Not on file  . Marital Status: Not on file     Observations/Objective: Awake, alert and oriented x 3   Review of Systems  Constitutional: Negative for fever, malaise/fatigue and weight loss.  HENT: Negative.  Negative for nosebleeds.   Eyes: Negative.  Negative for blurred vision, double vision and photophobia.  Respiratory: Negative.   Negative for cough and shortness of breath.   Cardiovascular: Negative.  Negative for chest pain, palpitations and leg swelling.  Gastrointestinal: Negative.  Negative for heartburn, nausea and vomiting.  Musculoskeletal: Negative.  Negative for myalgias.  Neurological: Negative.  Negative for dizziness, focal weakness, seizures and headaches.  Endo/Heme/Allergies: Positive for environmental allergies.  Psychiatric/Behavioral: Negative.  Negative for suicidal ideas.    Assessment and Plan: Timothy Russell was seen today for establish care.  Diagnoses and all orders for this visit:  Encounter to establish care  Encounter for screening for diabetes mellitus -     Hemoglobin A1c  Dyslipidemia, goal LDL below 100 -     Lipid panel  Screening for deficiency anemia -     CBC  Screening for metabolic disorder -     [id number]  Thyroid disorder screening -     TSH     Follow Up Instructions Return for Physical ONLY no labs.     I discussed the assessment and treatment plan with the patient. The patient was provided an opportunity to ask questions and all were answered. The patient agreed with the plan and demonstrated an understanding of the instructions.   The patient was advised to call back or seek an in-person evaluation if the symptoms worsen or if the condition fails to improve as anticipated.  I provided 14 minutes of non-face-to-face time during this encounter including median intraservice time, reviewing previous notes, labs, imaging, medications and explaining diagnosis and management.  Gildardo Pounds, FNP-BC

## 2020-05-08 LAB — LIPID PANEL
Chol/HDL Ratio: 5.2 ratio — ABNORMAL HIGH (ref 0.0–5.0)
Cholesterol, Total: 220 mg/dL — ABNORMAL HIGH (ref 100–199)
HDL: 42 mg/dL (ref >39)
LDL Chol Calc (NIH): 105 mg/dL — ABNORMAL HIGH (ref 0–99)
Triglycerides: 431 mg/dL — ABNORMAL HIGH (ref 0–149)
VLDL Cholesterol Cal: 73 mg/dL — ABNORMAL HIGH (ref 5–40)

## 2020-05-08 LAB — CBC
Hematocrit: 52.8 % — ABNORMAL HIGH (ref 37.5–51.0)
Hemoglobin: 17.9 g/dL — ABNORMAL HIGH (ref 13.0–17.7)
MCH: 29.4 pg (ref 26.6–33.0)
MCHC: 33.9 g/dL (ref 31.5–35.7)
MCV: 87 fL (ref 79–97)
Platelets: 225 10*3/uL (ref 150–450)
RBC: 6.09 x10E6/uL — ABNORMAL HIGH (ref 4.14–5.80)
RDW: 13.4 % (ref 11.6–15.4)
WBC: 6.5 10*3/uL (ref 3.4–10.8)

## 2020-05-08 LAB — CMP14+EGFR
ALT: 26 IU/L (ref 0–44)
AST: 18 IU/L (ref 0–40)
Albumin/Globulin Ratio: 1.9 (ref 1.2–2.2)
Albumin: 4.7 g/dL (ref 4.0–5.0)
Alkaline Phosphatase: 115 IU/L (ref 44–121)
BUN/Creatinine Ratio: 18 (ref 9–20)
BUN: 15 mg/dL (ref 6–24)
Bilirubin Total: 0.7 mg/dL (ref 0.0–1.2)
CO2: 23 mmol/L (ref 20–29)
Calcium: 9.9 mg/dL (ref 8.7–10.2)
Chloride: 99 mmol/L (ref 96–106)
Creatinine, Ser: 0.82 mg/dL (ref 0.76–1.27)
GFR calc Af Amer: 123 mL/min/{1.73_m2} (ref >59)
GFR calc non Af Amer: 107 mL/min/{1.73_m2} (ref >59)
Globulin, Total: 2.5 g/dL (ref 1.5–4.5)
Glucose: 100 mg/dL — ABNORMAL HIGH (ref 65–99)
Potassium: 4.3 mmol/L (ref 3.5–5.2)
Sodium: 140 mmol/L (ref 134–144)
Total Protein: 7.2 g/dL (ref 6.0–8.5)

## 2020-05-08 LAB — HEMOGLOBIN A1C
Est. average glucose Bld gHb Est-mCnc: 120 mg/dL
Hgb A1c MFr Bld: 5.8 % — ABNORMAL HIGH (ref 4.8–5.6)

## 2020-05-08 LAB — TSH: TSH: 0.824 u[IU]/mL (ref 0.450–4.500)

## 2020-05-12 ENCOUNTER — Ambulatory Visit: Payer: Self-pay

## 2020-05-18 ENCOUNTER — Other Ambulatory Visit: Payer: Self-pay | Admitting: Nurse Practitioner

## 2020-05-18 MED ORDER — ATORVASTATIN CALCIUM 40 MG PO TABS: 40.0000 mg | ORAL_TABLET | Freq: Every day | ORAL | 3 refills | Status: DC

## 2020-06-17 ENCOUNTER — Ambulatory Visit: Payer: Self-pay | Attending: Nurse Practitioner | Admitting: Nurse Practitioner

## 2020-06-17 ENCOUNTER — Other Ambulatory Visit: Payer: Self-pay

## 2020-06-17 ENCOUNTER — Encounter: Payer: Self-pay | Admitting: Nurse Practitioner

## 2020-06-17 VITALS — BP 104/69 | HR 60 | Temp 96.4°F | Ht 70.0 in | Wt 179.0 lb

## 2020-06-17 DIAGNOSIS — Z0000 Encounter for general adult medical examination without abnormal findings: Secondary | ICD-10-CM

## 2020-06-17 DIAGNOSIS — E782 Mixed hyperlipidemia: Secondary | ICD-10-CM

## 2020-06-17 DIAGNOSIS — Z114 Encounter for screening for human immunodeficiency virus [HIV]: Secondary | ICD-10-CM

## 2020-06-17 DIAGNOSIS — R7303 Prediabetes: Secondary | ICD-10-CM

## 2020-06-17 DIAGNOSIS — Z1159 Encounter for screening for other viral diseases: Secondary | ICD-10-CM

## 2020-06-17 LAB — GLUCOSE, POCT (MANUAL RESULT ENTRY): POC Glucose: 100 mg/dl — AB (ref 70–99)

## 2020-06-17 MED ORDER — IBUPROFEN 600 MG PO TABS: 600.0000 mg | ORAL_TABLET | Freq: Three times a day (TID) | ORAL | 1 refills | Status: DC | PRN

## 2020-06-17 NOTE — Progress Notes (Signed)
Assessment & Plan:  Timothy Russell was seen today for annual exam.  Diagnoses and all orders for this visit:  Encounter for annual physical exam  Need for hepatitis C screening test -     Hepatitis C Antibody  Encounter for screening for HIV -     HIV antibody (with reflex)  Prediabetes -     Glucose (CBG)  Other orders -     ibuprofen (ADVIL) 600 MG tablet; Take 1 tablet (600 mg total) by mouth every 8 (eight) hours as needed.    Patient has been counseled on age-appropriate routine health concerns for screening and prevention. These are reviewed and up-to-date. Referrals have been placed accordingly. Immunizations are up-to-date or declined.    Subjective:   Chief Complaint  Patient presents with   Annual Exam    Pt. is here for a physical.    HPI Timothy Russell 44 y.o. male presents to office today for physical. We had a long discussion regarding his cholesterol levels. He states he had just come back from [place] and had been eating all of his cultural foods which are high in cholesterol. He would like to try being off atorvastatin for 3 months and then return for fasting labs now that he is back home and eating healthier.     Review of Systems  Constitutional: Negative for fever, malaise/fatigue and weight loss.  HENT: Negative.  Negative for nosebleeds.   Eyes: Negative.  Negative for blurred vision, double vision and photophobia.  Respiratory: Negative.  Negative for cough and shortness of breath.   Cardiovascular: Negative.  Negative for chest pain, palpitations and leg swelling.  Gastrointestinal: Negative.  Negative for heartburn, nausea and vomiting.  Genitourinary: Negative.   Musculoskeletal: Negative.  Negative for myalgias.  Skin: Negative.   Neurological: Negative.  Negative for dizziness, focal weakness, seizures and headaches.  Endo/Heme/Allergies: Negative.   Psychiatric/Behavioral: Negative.  Negative for suicidal ideas.    Past Medical History:    Diagnosis Date   Hyperlipidemia    Perennial allergic rhinitis     Past Surgical History:  Procedure Laterality Date   NO PAST SURGERIES      Family History  Problem Relation Age of Onset   Hypertension Mother    Allergic rhinitis Neg Hx    Angioedema Neg Hx    Asthma Neg Hx    Eczema Neg Hx    Urticaria Neg Hx    Immunodeficiency Neg Hx    Atopy Neg Hx     Social History Reviewed with no changes to be made today.   Outpatient Medications Prior to Visit  Medication Sig Dispense Refill   atorvastatin (LIPITOR) 40 MG tablet Take 1 tablet (40 mg total) by mouth daily. 90 tablet 3   cetirizine (ZYRTEC) 10 MG tablet Take 10 mg by mouth daily.     montelukast (SINGULAIR) 10 MG tablet Take 1 tablet (10 mg total) by mouth at bedtime. 30 tablet 5   ibuprofen (ADVIL) 600 MG tablet Take 1 tablet (600 mg total) by mouth every 8 (eight) hours as needed. 30 tablet 0   No facility-administered medications prior to visit.    No Known Allergies     Objective:    BP 104/69 (BP Location: Left Arm, Patient Position: Sitting, Cuff Size: Normal)    Pulse 60    Temp (!) 96.4 F (35.8 C) (Temporal)    Ht 5\' 10"  (1.778 m)    Wt 179 lb (81.2 kg)    SpO2 96%  BMI 25.68 kg/m  Wt Readings from Last 3 Encounters:  06/17/20 179 lb (81.2 kg)  05/07/20 185 lb (83.9 kg)  11/20/19 193 lb (87.5 kg)    Physical Exam Constitutional:      Appearance: He is well-developed.  HENT:     Head: Normocephalic and atraumatic.     Right Ear: Hearing, tympanic membrane, ear canal and external ear normal.     Left Ear: Hearing, tympanic membrane, ear canal and external ear normal.     Nose: Nose normal. No mucosal edema or rhinorrhea.     Mouth/Throat:     Pharynx: Uvula midline.     Tonsils: No tonsillar exudate. 1+ on the right. 1+ on the left.  Eyes:     General: Lids are normal. No scleral icterus.    Extraocular Movements: Extraocular movements intact.     Right eye: Normal  extraocular motion and no nystagmus.     Left eye: Normal extraocular motion and no nystagmus.     Conjunctiva/sclera: Conjunctivae normal.     Pupils: Pupils are equal, round, and reactive to light.  Neck:     Thyroid: No thyromegaly.     Trachea: No tracheal deviation.  Cardiovascular:     Rate and Rhythm: Normal rate and regular rhythm.     Heart sounds: Normal heart sounds. No murmur heard.  No friction rub. No gallop.   Pulmonary:     Effort: Pulmonary effort is normal. No respiratory distress.     Breath sounds: Normal breath sounds. No wheezing or rales.  Chest:     Chest wall: No mass or tenderness.     Breasts:        Right: No inverted nipple, mass, nipple discharge, skin change or tenderness.        Left: No inverted nipple, mass, nipple discharge, skin change or tenderness.  Abdominal:     General: Bowel sounds are normal. There is no distension.     Palpations: Abdomen is soft. There is no mass.     Tenderness: There is no abdominal tenderness. There is no guarding or rebound.     Hernia: There is no hernia in the left inguinal area.  Genitourinary:    Penis: Normal.      Testes: Normal.        Right: Mass, tenderness or swelling not present. Right testis is descended. Cremasteric reflex is present.         Left: Mass, tenderness or swelling not present. Left testis is descended. Cremasteric reflex is present.   Musculoskeletal:        General: No tenderness or deformity. Normal range of motion.     Cervical back: Normal range of motion and neck supple.  Lymphadenopathy:     Cervical: No cervical adenopathy.     Lower Body: No right inguinal adenopathy. No left inguinal adenopathy.  Skin:    General: Skin is warm and dry.     Capillary Refill: Capillary refill takes less than 2 seconds.     Findings: No erythema.  Neurological:     Mental Status: He is alert and oriented to person, place, and time.     Cranial Nerves: Cranial nerves are intact. No cranial nerve  deficit.     Sensory: Sensation is intact.     Motor: Motor function is intact. No abnormal muscle tone.     Coordination: Coordination is intact. Coordination normal.     Gait: Gait is intact.     Deep Tendon  Reflexes: Reflexes normal.     Reflex Scores:      Patellar reflexes are 2+ on the right side and 2+ on the left side. Psychiatric:        Behavior: Behavior normal.        Thought Content: Thought content normal.        Judgment: Judgment normal.          Patient has been counseled extensively about nutrition and exercise as well as the importance of adherence with medications and regular follow-up. The patient was given clear instructions to go to ER or return to medical center if symptoms don't improve, worsen or new problems develop. The patient verbalized understanding.   Follow-up: Return in about 3 months (around 09/17/2020) for FASTING LIPIDS.   Claiborne Rigg, FNP-BC [facility] [place], [place] [phone]   06/17/2020, 10:15 AM

## 2020-06-18 LAB — HIV ANTIBODY (ROUTINE TESTING W REFLEX): HIV Screen 4th Generation wRfx: NONREACTIVE

## 2020-06-18 LAB — HEPATITIS C ANTIBODY: Hep C Virus Ab: <0.1 s/co ratio (ref 0.0–0.9)

## 2020-09-17 ENCOUNTER — Other Ambulatory Visit: Payer: Self-pay

## 2020-10-06 ENCOUNTER — Other Ambulatory Visit: Payer: Self-pay

## 2020-10-15 ENCOUNTER — Other Ambulatory Visit: Payer: Self-pay

## 2020-10-15 ENCOUNTER — Ambulatory Visit: Payer: Self-pay | Attending: Nurse Practitioner

## 2020-10-15 DIAGNOSIS — E782 Mixed hyperlipidemia: Secondary | ICD-10-CM

## 2020-10-16 LAB — LIPID PANEL
Chol/HDL Ratio: 4.1 ratio (ref 0.0–5.0)
Cholesterol, Total: 273 mg/dL — ABNORMAL HIGH (ref 100–199)
HDL: 66 mg/dL (ref >39)
LDL Chol Calc (NIH): 182 mg/dL — ABNORMAL HIGH (ref 0–99)
Triglycerides: 137 mg/dL (ref 0–149)
VLDL Cholesterol Cal: 25 mg/dL (ref 5–40)

## 2020-11-11 ENCOUNTER — Ambulatory Visit: Payer: Self-pay | Admitting: Allergy and Immunology

## 2020-11-18 ENCOUNTER — Ambulatory Visit (INDEPENDENT_AMBULATORY_CARE_PROVIDER_SITE_OTHER): Payer: Self-pay | Admitting: Allergy and Immunology

## 2020-11-18 ENCOUNTER — Other Ambulatory Visit: Payer: Self-pay

## 2020-11-18 ENCOUNTER — Encounter: Payer: Self-pay | Admitting: Allergy and Immunology

## 2020-11-18 VITALS — BP 122/82 | HR 79 | Temp 98.1°F | Ht 70.0 in | Wt 186.6 lb

## 2020-11-18 DIAGNOSIS — J301 Allergic rhinitis due to pollen: Secondary | ICD-10-CM

## 2020-11-18 DIAGNOSIS — H1010 Acute atopic conjunctivitis, unspecified eye: Secondary | ICD-10-CM

## 2020-11-18 DIAGNOSIS — J3089 Other allergic rhinitis: Secondary | ICD-10-CM

## 2020-11-18 MED ORDER — METHYLPREDNISOLONE ACETATE 80 MG/ML IJ SUSP
80.0000 mg | Freq: Once | INTRAMUSCULAR | Status: AC
  Administered 2020-11-18: 80 mg via INTRAMUSCULAR

## 2020-11-18 MED ORDER — MONTELUKAST SODIUM 10 MG PO TABS
10.0000 mg | ORAL_TABLET | Freq: Every day | ORAL | 11 refills | Status: DC
  Filled 2020-11-18: qty 30, 30d supply, fill #0
  Filled 2020-12-28: qty 30, 30d supply, fill #1
  Filled 2021-01-30: qty 30, 30d supply, fill #2
  Filled 2021-03-05: qty 30, 30d supply, fill #3
  Filled 2021-04-07: qty 30, 30d supply, fill #4
  Filled 2021-05-12: qty 30, 30d supply, fill #5
  Filled 2021-06-11: qty 30, 30d supply, fill #6

## 2020-11-18 NOTE — Patient Instructions (Signed)
  1. Depomedrol 80 mg IM today  2. Continue Montelukast 10mg  one tablet one time per day   3. Use OTC Zyrtec 10mg  one time a day if needed  4. Use OTC Nasacort one spray each nostril one time if needed  5. Use OTC Pataday 1 drop each eye 1 time a day if needed  6. Return in 1 year or earlier if problem.

## 2020-11-18 NOTE — Progress Notes (Signed)
[place] - [place] - [place] - [place] - [place]   Follow-up Note  Referring Provider: Claiborne Rigg, NP Primary Provider: Claiborne Rigg, NP Date of Office Visit: 11/18/2020  Subjective:   Timothy Russell (DOB: 1975/08/20) is a 45 y.o. male who [facility] 11[facility]18/2020 in re-evaluation of the following:  HPI: Timothy Russell returns to this clinic in evaluation of allergic rhinoconjunctivitis.  His last visit to this clinic was 20 November 2019.  For the majority of the year he is doing very well while utilizing his montelukast and Zyrtec and Nasacort regarding both his upper airway and eye issues but since the spring has arrived he has had significant problems with his eyes and his nose has been congested and has had a lot of sneezing.  He has not had any anosmia or ugly nasal discharge or headaches or other signs of infection.  Allergies as of 11/18/2020   No Known Allergies     Medication List      atorvastatin 40 MG tablet Commonly known as: LIPITOR Take 1 tablet (40 mg total) by mouth daily.   cetirizine 10 MG tablet Commonly known as: ZYRTEC Take 10 mg by mouth daily.   ibuprofen 600 MG tablet Commonly known as: ADVIL Take 1 tablet (600 mg total) by mouth every 8 (eight) hours as needed.   montelukast 10 MG tablet Commonly known as: SINGULAIR Take 1 tablet (10 mg total) by mouth at bedtime.       Past Medical History:  Diagnosis Date  . Hyperlipidemia   . Perennial allergic rhinitis     Past Surgical History:  Procedure Laterality Date  . NO PAST SURGERIES      Review of systems negative except as noted in HPI / PMHx or noted below:  Review of Systems  Constitutional: Negative.   HENT: Negative.   Eyes: Negative.   Respiratory: Negative.   Cardiovascular: Negative.   Gastrointestinal: Negative.   Genitourinary: Negative.   Musculoskeletal: Negative.   Skin: Negative.   Neurological: Negative.    Endo/Heme/Allergies: Negative.   Psychiatric/Behavioral: Negative.      Objective:   Vitals:   11/18/20 1113  BP: 122/82  Pulse: 79  Temp: 98.1 F (36.7 C)  SpO2: 96%   Height: 5\' 10"  (177.8 cm)  Weight: 186 lb 9.6 oz (84.6 kg)   Physical Exam Constitutional:      Appearance: He is not diaphoretic.  HENT:     Head: Normocephalic.     Right Ear: Tympanic membrane, ear canal and external ear normal.     Left Ear: Tympanic membrane, ear canal and external ear normal.     Nose: Nose normal. No mucosal edema or rhinorrhea.     Mouth/Throat:     Pharynx: Uvula midline. No oropharyngeal exudate.  Eyes:     Conjunctiva/sclera: Conjunctivae normal.  Neck:     Thyroid: No thyromegaly.     Trachea: Trachea normal. No tracheal tenderness or tracheal deviation.  Cardiovascular:     Rate and Rhythm: Normal rate and regular rhythm.     Heart sounds: Normal heart sounds, S1 normal and S2 normal. No murmur heard.   Pulmonary:     Effort: No respiratory distress.     Breath sounds: Normal breath sounds. No stridor. No wheezing or rales.  Lymphadenopathy:     Head:     Right side of head: No tonsillar adenopathy.     Left side of head: No tonsillar  adenopathy.     Cervical: No cervical adenopathy.  Skin:    Findings: No erythema or rash.     Nails: There is no clubbing.  Neurological:     Mental Status: He is alert.     Diagnostics: none   Assessment and Plan:   1. Perennial allergic rhinitis   2. Seasonal allergic rhinitis due to pollen   3. Seasonal allergic conjunctivitis     1. Depomedrol 80 mg IM today  2. Continue Montelukast 10mg  one tablet one time per day   3. Use OTC Zyrtec 10mg  one time a day if needed  4. Use OTC Nasacort one spray each nostril one time if needed  5. Use OTC Pataday 1 drop each eye 1 time a day if needed  6. Return in 1 year or earlier if problem.  I will have Timothy Russell utilize the plan noted above which includes a systemic steroid.   He seems to require a systemic steroid 1 time per year and I do not think we are going to get into any trouble utilizing this form of treatment as long as we keep it to 1 time per year.  He will continue on his other anti-inflammatory agents for his airway and conjunctiva as noted above.  I will see him back in his clinic in 1 year or earlier if there is a problem.  , MD Allergy / Immunology [facility]

## 2020-11-19 ENCOUNTER — Other Ambulatory Visit: Payer: Self-pay

## 2020-11-19 ENCOUNTER — Encounter: Payer: Self-pay | Admitting: Allergy and Immunology

## 2020-12-30 ENCOUNTER — Other Ambulatory Visit: Payer: Self-pay

## 2021-01-30 ENCOUNTER — Other Ambulatory Visit: Payer: Self-pay

## 2021-03-05 ENCOUNTER — Other Ambulatory Visit: Payer: Self-pay

## 2021-04-07 ENCOUNTER — Other Ambulatory Visit: Payer: Self-pay

## 2021-04-09 ENCOUNTER — Other Ambulatory Visit: Payer: Self-pay

## 2021-05-12 ENCOUNTER — Other Ambulatory Visit: Payer: Self-pay

## 2021-05-13 ENCOUNTER — Other Ambulatory Visit: Payer: Self-pay

## 2021-06-11 ENCOUNTER — Other Ambulatory Visit: Payer: Self-pay

## 2021-06-16 ENCOUNTER — Other Ambulatory Visit: Payer: Self-pay

## 2021-06-16 ENCOUNTER — Ambulatory Visit (INDEPENDENT_AMBULATORY_CARE_PROVIDER_SITE_OTHER): Payer: Self-pay | Admitting: Allergy and Immunology

## 2021-06-16 VITALS — BP 110/92 | HR 84 | Temp 97.8°F | Resp 20 | Ht 69.0 in | Wt 188.8 lb

## 2021-06-16 DIAGNOSIS — H1013 Acute atopic conjunctivitis, bilateral: Secondary | ICD-10-CM

## 2021-06-16 DIAGNOSIS — J301 Allergic rhinitis due to pollen: Secondary | ICD-10-CM

## 2021-06-16 DIAGNOSIS — H1010 Acute atopic conjunctivitis, unspecified eye: Secondary | ICD-10-CM

## 2021-06-16 DIAGNOSIS — L500 Allergic urticaria: Secondary | ICD-10-CM

## 2021-06-16 DIAGNOSIS — J3089 Other allergic rhinitis: Secondary | ICD-10-CM

## 2021-06-16 MED ORDER — CETIRIZINE HCL 10 MG PO TABS
20.0000 mg | ORAL_TABLET | Freq: Two times a day (BID) | ORAL | 11 refills | Status: DC
  Filled 2021-06-16 – 2021-07-13 (×3): qty 120, 30d supply, fill #0

## 2021-06-16 MED ORDER — MONTELUKAST SODIUM 10 MG PO TABS
10.0000 mg | ORAL_TABLET | Freq: Every day | ORAL | 5 refills | Status: DC
  Filled 2021-06-16 – 2021-07-13 (×2): qty 30, 30d supply, fill #0
  Filled 2021-07-29: qty 30, 30d supply, fill #1
  Filled 2021-08-12 – 2021-08-17 (×2): qty 30, 30d supply, fill #0
  Filled 2021-09-20: qty 30, 30d supply, fill #1
  Filled 2021-11-03: qty 30, 30d supply, fill #2

## 2021-06-16 MED ORDER — TRIAMCINOLONE ACETONIDE 55 MCG/ACT NA AERO
2.0000 | INHALATION_SPRAY | Freq: Every day | NASAL | 5 refills | Status: DC
  Filled 2021-06-16: qty 16.9, fill #0
  Filled 2021-06-16: qty 16.5, fill #0

## 2021-06-16 MED ORDER — OLOPATADINE HCL 0.2 % OP SOLN
1.0000 [drp] | OPHTHALMIC | 5 refills | Status: DC
  Filled 2021-06-16: qty 2.5, 20d supply, fill #0

## 2021-06-16 NOTE — Patient Instructions (Signed)
  1. For the next 6 weeks use the following:   A. Montelukast 10 mg - 1 tablet 1 time per day B. Cetirizine 10 mg - 1-2 tablets 1-2 times per day (MAX=40mg /day)  2. Continue the following medications if needed:  A. Montelukast 10mg  - 1 tablet one time per day - spring B. Zyrtec 10mg  - 1 time a day - spring C. Nasacort - 1 spray each nostril 1-2 times per day - spring D. Pataday 1 drop each eye 1 time a day - spring  3. Further evaluation after 6 weeks. Yes, if recurrent hives.

## 2021-06-16 NOTE — Progress Notes (Signed)
[place]   Follow-up Note  Referring Provider: Claiborne Rigg, NP Primary Provider: Claiborne Rigg, NP Date of Office Visit: 06/16/2021  Subjective:   Timothy Russell (DOB: [birth year]) is a 45 y.o. male who [facility] in re-evaluation of the following:  HPI: Timothy Russell presents to this clinic in evaluation of hives.  I last saw him in this clinic for allergic rhinoconjunctivitis usually precipitated by spring pollination season.  His last visit to this clinic was 18 November 2020.  For about the past month he has been having an issue with intermittent red raised itchy lesions especially at areas of pressure such as his waistband.  This can occur a few times per week.  His lesions never heal with scar or hyperpigmentation and are usually gone within a day.  He has no associated systemic or constitutional symptoms with this issue.  They are definitely itchy when they do occur.  He will take montelukast and Zyrtec and everything will resolve in a few hours.  He does not note an obvious provoking factor giving rise to this issue.  He has not started any new medications or supplements or health foods or herbs.  He has not started any new medications.  He has not had a significant environmental change.  He does not have symptoms of an ongoing infectious disease.  His pre-existing atopic disease consisting of allergic rhinoconjunctivitis usually during the spring is under very good control at this point in time and he really does not require any medications for this issue at this point.  Allergies as of 06/16/2021   No Known Allergies      Medication List    atorvastatin 40 MG tablet Commonly known as: LIPITOR Take 1 tablet (40 mg total) by mouth daily.   atorvastatin 40 MG tablet Commonly known as: LIPITOR TAKE 1 TABLET (40 MG TOTAL) BY MOUTH DAILY.   cetirizine 10 MG tablet Commonly known  as: ZYRTEC Take 10 mg by mouth daily.   ibuprofen 600 MG tablet Commonly known as: ADVIL Take 1 tablet (600 mg total) by mouth every 8 (eight) hours as needed.   montelukast 10 MG tablet Commonly known as: SINGULAIR Take 1 tablet (10 mg total) by mouth at bedtime.    Past Medical History:  Diagnosis Date   Hyperlipidemia    Perennial allergic rhinitis     Past Surgical History:  Procedure Laterality Date   NO PAST SURGERIES      Review of systems negative except as noted in HPI / PMHx or noted below:  Review of Systems  Constitutional: Negative.   HENT: Negative.    Eyes: Negative.   Respiratory: Negative.    Cardiovascular: Negative.   Gastrointestinal: Negative.   Genitourinary: Negative.   Musculoskeletal: Negative.   Skin: Negative.   Neurological: Negative.   Endo/Heme/Allergies: Negative.   Psychiatric/Behavioral: Negative.      Objective:   Vitals:   06/16/21 0933  BP: (!) 110/92  Pulse: 84  Resp: 20  Temp: 97.8 F (36.6 C)  SpO2: (!) 20%   Height: 5\' 9"  (175.3 cm)  Weight: 188 lb 12.8 oz (85.6 kg)   Physical Exam Constitutional:      Appearance: He is not diaphoretic.  HENT:     Head: Normocephalic.     Right Ear: Tympanic membrane, ear canal and external ear normal.     Left Ear: Tympanic membrane, ear canal  and external ear normal.     Nose: Nose normal. No mucosal edema or rhinorrhea.     Mouth/Throat:     Pharynx: Uvula midline. No oropharyngeal exudate.  Eyes:     Conjunctiva/sclera: Conjunctivae normal.  Neck:     Thyroid: No thyromegaly.     Trachea: Trachea normal. No tracheal tenderness or tracheal deviation.  Cardiovascular:     Rate and Rhythm: Normal rate and regular rhythm.     Heart sounds: Normal heart sounds, S1 normal and S2 normal. No murmur heard. Pulmonary:     Effort: No respiratory distress.     Breath sounds: Normal breath sounds. No stridor. No wheezing or rales.  Lymphadenopathy:     Head:     Right side of  head: No tonsillar adenopathy.     Left side of head: No tonsillar adenopathy.     Cervical: No cervical adenopathy.  Skin:    Findings: No erythema or rash.     Nails: There is no clubbing.  Neurological:     Mental Status: He is alert.    Diagnostics: none  Assessment and Plan:   1. Allergic urticaria   2. Perennial allergic rhinitis   3. Seasonal allergic rhinitis due to pollen   4. Seasonal allergic conjunctivitis     1. For the next 6 weeks use the following:   A. Montelukast 10 mg - 1 tablet 1 time per day B. Cetirizine 10 mg - 1-2 tablets 1-2 times per day (MAX=40mg /day)  2. Continue the following medications if needed:  A. Montelukast 10mg  - 1 tablet one time per day - spring B. Zyrtec 10mg  - 1 time a day - spring C. Nasacort - 1 spray each nostril 1-2 times per day - spring D. Pataday 1 drop each eye 1 time a day - spring  3. Further evaluation after 6 weeks. Yes, if recurrent hives.  Nolberto has some form of immune activation that is giving rise to his urticaria.  We will attempt to have him get this under good control with a leukotriene modifier and a antihistamine utilized on a consistent basis over the course of the next 6 weeks.  The assumption will be that he will resolve his immune activation during this 6-week interval.  If he still has significant problems in the face of this treatment or develops associated systemic or constitutional symptoms he will definitely require further evaluation for the etiologic factor responsible for his immune activation.  His atopic upper airway and eye disease appears to be under good control and this is really an issue that flares mostly during the spring.  He will keep in contact with me noting his response to this approach.  Lars Mage, MD Allergy / Immunology [facility]

## 2021-06-17 ENCOUNTER — Encounter: Payer: Self-pay | Admitting: Allergy and Immunology

## 2021-06-17 ENCOUNTER — Other Ambulatory Visit: Payer: Self-pay

## 2021-07-13 ENCOUNTER — Other Ambulatory Visit: Payer: Self-pay

## 2021-07-15 ENCOUNTER — Other Ambulatory Visit: Payer: Self-pay

## 2021-07-30 ENCOUNTER — Other Ambulatory Visit: Payer: Self-pay

## 2021-08-14 ENCOUNTER — Other Ambulatory Visit: Payer: Self-pay

## 2021-08-14 ENCOUNTER — Other Ambulatory Visit (HOSPITAL_COMMUNITY): Payer: Self-pay

## 2021-08-17 ENCOUNTER — Other Ambulatory Visit: Payer: Self-pay

## 2021-08-20 ENCOUNTER — Other Ambulatory Visit: Payer: Self-pay

## 2021-09-21 ENCOUNTER — Other Ambulatory Visit: Payer: Self-pay

## 2021-09-24 ENCOUNTER — Other Ambulatory Visit: Payer: Self-pay

## 2021-11-03 ENCOUNTER — Other Ambulatory Visit (HOSPITAL_COMMUNITY): Payer: Self-pay

## 2021-11-06 ENCOUNTER — Other Ambulatory Visit: Payer: Self-pay

## 2021-11-06 ENCOUNTER — Other Ambulatory Visit (HOSPITAL_COMMUNITY): Payer: Self-pay

## 2021-11-24 ENCOUNTER — Encounter: Payer: Self-pay | Admitting: Allergy and Immunology

## 2021-11-24 ENCOUNTER — Ambulatory Visit: Payer: Self-pay | Admitting: Allergy and Immunology

## 2021-11-24 ENCOUNTER — Other Ambulatory Visit: Payer: Self-pay

## 2021-11-24 VITALS — BP 118/88 | HR 74 | Temp 96.7°F | Resp 18 | Ht 68.0 in | Wt 188.8 lb

## 2021-11-24 DIAGNOSIS — J3089 Other allergic rhinitis: Secondary | ICD-10-CM

## 2021-11-24 DIAGNOSIS — J301 Allergic rhinitis due to pollen: Secondary | ICD-10-CM

## 2021-11-24 DIAGNOSIS — H1010 Acute atopic conjunctivitis, unspecified eye: Secondary | ICD-10-CM

## 2021-11-24 DIAGNOSIS — Z872 Personal history of diseases of the skin and subcutaneous tissue: Secondary | ICD-10-CM

## 2021-11-24 MED ORDER — FLUTICASONE PROPIONATE 50 MCG/ACT NA SUSP
1.0000 | Freq: Every day | NASAL | 11 refills | Status: AC
  Filled 2021-11-24: qty 16.9, fill #0
  Filled 2021-12-17: qty 16, 30d supply, fill #0
  Filled 2021-12-17: qty 16.9, fill #0
  Filled 2022-01-25: qty 16, 30d supply, fill #1
  Filled 2022-01-25 – 2022-02-05 (×2): qty 16, 30d supply, fill #0
  Filled 2022-06-17: qty 16, 30d supply, fill #1
  Filled 2022-09-12: qty 16, 30d supply, fill #2
  Filled 2022-09-28 – 2022-10-13 (×2): qty 16, 30d supply, fill #3
  Filled 2022-10-21 – 2022-11-07 (×2): qty 16, 30d supply, fill #4

## 2021-11-24 MED ORDER — OLOPATADINE HCL 0.2 % OP SOLN
1.0000 [drp] | OPHTHALMIC | 11 refills | Status: DC
  Filled 2021-11-24 – 2021-12-17 (×2): qty 2.5, 20d supply, fill #0
  Filled 2022-01-25 – 2022-02-05 (×2): qty 2.5, 20d supply, fill #1
  Filled 2022-05-02: qty 2.5, 20d supply, fill #2
  Filled 2022-06-17: qty 2.5, 20d supply, fill #3
  Filled 2022-09-12: qty 2.5, 20d supply, fill #4
  Filled 2022-09-28 – 2022-09-30 (×4): qty 2.5, 20d supply, fill #5
  Filled 2022-10-21: qty 2.5, 20d supply, fill #6
  Filled 2022-11-07: qty 2.5, 20d supply, fill #7

## 2021-11-24 MED ORDER — MONTELUKAST SODIUM 10 MG PO TABS
10.0000 mg | ORAL_TABLET | Freq: Every day | ORAL | 11 refills | Status: DC
  Filled 2021-11-24 – 2021-12-17 (×2): qty 30, 30d supply, fill #0
  Filled 2022-01-15: qty 30, 30d supply, fill #1
  Filled 2022-01-25: qty 30, 30d supply, fill #0
  Filled 2022-01-25: qty 30, 30d supply, fill #2
  Filled 2022-02-14 – 2022-03-05 (×4): qty 30, 30d supply, fill #0
  Filled 2022-04-02: qty 30, 30d supply, fill #1
  Filled 2022-05-02: qty 30, 30d supply, fill #2
  Filled 2022-05-20 – 2022-05-31 (×2): qty 30, 30d supply, fill #3
  Filled 2022-06-15 – 2022-06-17 (×2): qty 30, 30d supply, fill #4
  Filled 2022-07-06 – 2022-07-16 (×2): qty 30, 30d supply, fill #5
  Filled 2022-08-25 – 2022-09-01 (×4): qty 30, 30d supply, fill #6
  Filled 2022-09-12: qty 30, 30d supply, fill #7
  Filled 2022-09-28: qty 30, 30d supply, fill #8
  Filled 2022-10-13: qty 30, 30d supply, fill #9
  Filled 2022-10-13: qty 30, 30d supply, fill #8

## 2021-11-24 MED ORDER — CETIRIZINE HCL 10 MG PO TABS
20.0000 mg | ORAL_TABLET | Freq: Two times a day (BID) | ORAL | 11 refills | Status: DC
  Filled 2021-11-24 – 2021-12-17 (×2): qty 60, 15d supply, fill #0
  Filled 2022-01-15: qty 60, 15d supply, fill #1
  Filled 2022-01-25: qty 60, 15d supply, fill #0
  Filled 2022-01-25: qty 60, 15d supply, fill #2
  Filled 2022-02-05: qty 60, 15d supply, fill #0
  Filled 2022-05-02: qty 60, 15d supply, fill #1
  Filled 2022-05-20: qty 60, 15d supply, fill #2
  Filled 2022-05-31: qty 60, 15d supply, fill #3
  Filled 2022-06-15: qty 60, 15d supply, fill #4
  Filled 2022-07-06: qty 60, 15d supply, fill #0
  Filled 2022-07-06 (×2): qty 60, 15d supply, fill #5
  Filled 2022-08-25 – 2022-09-01 (×4): qty 60, 15d supply, fill #1
  Filled 2022-09-12: qty 60, 15d supply, fill #2
  Filled 2022-09-28 – 2022-09-30 (×3): qty 60, 15d supply, fill #3
  Filled 2022-10-13: qty 60, 15d supply, fill #4

## 2021-11-24 NOTE — Patient Instructions (Addendum)
?  1. Continue Montelukast 10mg  one tablet one time per day  ? ?2. Continue OTC Zyrtec 10mg  one time a day   ? ?3. Continue OTC Nasacort 1-2 sprays each nostril one time   ? ?4. Use OTC Pataday 1 drop each eye 1 time a day if needed ? ?5. Return in 1 year or earlier if problem. ? ? ? ? ?

## 2021-11-24 NOTE — Progress Notes (Signed)
? ?[place] - [employer] - [place] - [place] - [place] ? ? ?Follow-up Note ? ?Referring Provider: Claiborne Rigg, NP ?Primary Provider: Claiborne Rigg, NP ?Date of Office Visit: 11/24/2021 ? ?Subjective:  ? ?Timothy Russell (DOB: 14-Dec-1975) is a 46 y.o. male who [facility] 11[facility]/2021 in re-evaluation of the following: ? ?HPI: Timothy Russell presents to this clinic in evaluation of allergic rhinoconjunctivitis and history of hives.  I last saw him in this clinic on 16 June 2021. ? ?When I last saw him in this clinic he was having a problem with hives.  We treated him with a systemic steroid and he had continued activity of his hives November through December 2022 but fortunately that appears to have resolved and has not been recurrent since that point in time. ? ?This spring he has done relatively well while consistently using medications directed against respiratory tract inflammation of his airway and his eyes.  He is very pleased with the response he is receiving while utilizing this treatment as he goes through this spring.  He has not required a systemic steroid or an antibiotic in 2023 for this issue. ? ?Allergies as of 11/24/2021   ?No Known Allergies ?  ? ?  ?Medication List  ? ? ?atorvastatin 40 MG tablet ?Commonly known as: LIPITOR ?TAKE 1 TABLET (40 MG TOTAL) BY MOUTH DAILY. ?  ?cetirizine 10 MG tablet ?Commonly known as: ZYRTEC ?Take 2 tablets (20 mg total) by mouth 2 (two) times daily. ?  ?ibuprofen 600 MG tablet ?Commonly known as: ADVIL ?Take 1 tablet (600 mg total) by mouth every 8 (eight) hours as needed. ?  ?montelukast 10 MG tablet ?Commonly known as: SINGULAIR ?Take 1 tablet (10 mg total) by mouth at bedtime. ?  ?Olopatadine HCl 0.2 % Soln ?Commonly known as: Pataday ?Place 1 drop into both eyes daily ?  ?triamcinolone 55 MCG/ACT Aero nasal inhaler ?Commonly known as: NASACORT ?Place 2 sprays into the nose daily. ?  ? ?Past Medical History:  ?Diagnosis  Date  ? Hyperlipidemia   ? Perennial allergic rhinitis   ? ? ?Past Surgical History:  ?Procedure Laterality Date  ? NO PAST SURGERIES    ? ? ?Review of systems negative except as noted in HPI / PMHx or noted below: ? ?Review of Systems  ?Constitutional: Negative.   ?HENT: Negative.    ?Eyes: Negative.   ?Respiratory: Negative.    ?Cardiovascular: Negative.   ?Gastrointestinal: Negative.   ?Genitourinary: Negative.   ?Musculoskeletal: Negative.   ?Skin: Negative.   ?Neurological: Negative.   ?Endo/Heme/Allergies: Negative.   ?Psychiatric/Behavioral: Negative.    ? ? ?Objective:  ? ?Vitals:  ? 11/24/21 0915  ?BP: 118/88  ?Pulse: 74  ?Resp: 18  ?Temp: (!) 96.7 ?F (35.9 ?C)  ?SpO2: 96%  ? ?Height: 5\' 8"  (172.7 cm)  ?Weight: 188 lb 12.8 oz (85.6 kg)  ? ?Physical Exam ?Constitutional:   ?   Appearance: He is not diaphoretic.  ?   Comments: Allergic shiners  ?HENT:  ?   Head: Normocephalic.  ?   Right Ear: Tympanic membrane, ear canal and external ear normal.  ?   Left Ear: Tympanic membrane, ear canal and external ear normal.  ?   Nose: Nose normal. No mucosal edema or rhinorrhea.  ?   Mouth/Throat:  ?   Pharynx: Uvula midline. No oropharyngeal exudate.  ?Eyes:  ?   Conjunctiva/sclera: Conjunctivae normal.  ?Neck:  ?   Thyroid: No thyromegaly.  ?  Trachea: Trachea normal. No tracheal tenderness or tracheal deviation.  ?Cardiovascular:  ?   Rate and Rhythm: Normal rate and regular rhythm.  ?   Heart sounds: Normal heart sounds, S1 normal and S2 normal. No murmur heard. ?Pulmonary:  ?   Effort: No respiratory distress.  ?   Breath sounds: Normal breath sounds. No stridor. No wheezing or rales.  ?Lymphadenopathy:  ?   Head:  ?   Right side of head: No tonsillar adenopathy.  ?   Left side of head: No tonsillar adenopathy.  ?   Cervical: No cervical adenopathy.  ?Skin: ?   Findings: No erythema or rash.  ?   Nails: There is no clubbing.  ?Neurological:  ?   Mental Status: He is alert.  ? ? ?Diagnostics: none ? ?Assessment and  Plan:  ? ?1. Perennial allergic rhinitis   ?2. Seasonal allergic rhinitis due to pollen   ?3. Seasonal allergic conjunctivitis   ?4. History of urticaria   ? ? ?1. Continue Montelukast 10mg  one tablet one time per day  ? ?2. Continue OTC Zyrtec 10mg  one time a day   ? ?3. Continue OTC Nasacort 1-2 sprays each nostril one time   ? ?4. Use OTC Pataday 1 drop each eye 1 time a day if needed ? ?5. Return in 1 year or earlier if problem. ? ?Timothy Russell is doing relatively well at this point in time while using a leukotriene modifier and a nasal steroid regarding his upper airway atopic disease and he has several agents that he can utilize should they be required as noted above.  I will see him back in this clinic in 1 year or earlier if there is a problem.  Fortunately, we do not need for him to undergo any further evaluation for his previous episode of urticaria which was relatively short-lived and not recurrent. ? ? , MD ?Allergy / Immunology ?[facility] ?

## 2021-11-25 ENCOUNTER — Encounter: Payer: Self-pay | Admitting: Allergy and Immunology

## 2021-12-01 ENCOUNTER — Other Ambulatory Visit: Payer: Self-pay

## 2021-12-17 ENCOUNTER — Other Ambulatory Visit: Payer: Self-pay

## 2021-12-21 ENCOUNTER — Other Ambulatory Visit: Payer: Self-pay

## 2022-01-15 ENCOUNTER — Other Ambulatory Visit (HOSPITAL_COMMUNITY): Payer: Self-pay

## 2022-01-25 ENCOUNTER — Other Ambulatory Visit (HOSPITAL_COMMUNITY): Payer: Self-pay

## 2022-02-05 ENCOUNTER — Other Ambulatory Visit: Payer: Self-pay | Admitting: Nurse Practitioner

## 2022-02-06 ENCOUNTER — Other Ambulatory Visit (HOSPITAL_COMMUNITY): Payer: Self-pay

## 2022-02-08 NOTE — Telephone Encounter (Signed)
Requested medications are due for refill today.  Unsure  Requested medications are on the active medications list.  yes  Last refill. 05/18/2020 #90 3 refills  Future visit scheduled.   no  Notes to clinic.  Labs are expired. Rx expired 05/18/2021. PT is overdue for OV.    Requested Prescriptions  Pending Prescriptions Disp Refills   atorvastatin (LIPITOR) 40 MG tablet 90 tablet 3    Sig: TAKE 1 TABLET (40 MG TOTAL) BY MOUTH DAILY.     Cardiovascular:  Antilipid - Statins Failed - 02/05/2022  7:49 PM      Failed - Valid encounter within last 12 months    Recent Outpatient Visits           1 year ago Encounter for annual physical exam   [facility] [place], Shea Stakes, NP   1 year ago Encounter to establish care   [facility] [place], [place] W, NP              Failed - Lipid Panel in normal range within the last 12 months    Cholesterol, Total  Date Value Ref Range Status  10/15/2020 273 (H) 100 - 199 mg/dL Final   LDL Chol Calc (NIH)  Date Value Ref Range Status  10/15/2020 182 (H) 0 - 99 mg/dL Final   HDL  Date Value Ref Range Status  10/15/2020 66 >39 mg/dL Final   Triglycerides  Date Value Ref Range Status  10/15/2020 137 0 - 149 mg/dL Final         Passed - Patient is not pregnant

## 2022-02-10 ENCOUNTER — Other Ambulatory Visit (HOSPITAL_COMMUNITY): Payer: Self-pay

## 2022-02-15 ENCOUNTER — Other Ambulatory Visit: Payer: Self-pay

## 2022-02-15 ENCOUNTER — Other Ambulatory Visit (HOSPITAL_COMMUNITY): Payer: Self-pay

## 2022-02-19 ENCOUNTER — Other Ambulatory Visit: Payer: Self-pay

## 2022-03-05 ENCOUNTER — Other Ambulatory Visit (HOSPITAL_COMMUNITY): Payer: Self-pay

## 2022-03-05 ENCOUNTER — Other Ambulatory Visit: Payer: Self-pay

## 2022-03-08 ENCOUNTER — Other Ambulatory Visit: Payer: Self-pay

## 2022-04-02 ENCOUNTER — Other Ambulatory Visit (HOSPITAL_COMMUNITY): Payer: Self-pay

## 2022-05-04 ENCOUNTER — Other Ambulatory Visit (HOSPITAL_COMMUNITY): Payer: Self-pay

## 2022-05-20 ENCOUNTER — Other Ambulatory Visit (HOSPITAL_COMMUNITY): Payer: Self-pay

## 2022-05-31 ENCOUNTER — Other Ambulatory Visit (HOSPITAL_COMMUNITY): Payer: Self-pay

## 2022-06-15 ENCOUNTER — Other Ambulatory Visit (HOSPITAL_COMMUNITY): Payer: Self-pay

## 2022-06-18 ENCOUNTER — Other Ambulatory Visit (HOSPITAL_COMMUNITY): Payer: Self-pay

## 2022-06-19 ENCOUNTER — Other Ambulatory Visit (HOSPITAL_COMMUNITY): Payer: Self-pay

## 2022-07-06 ENCOUNTER — Other Ambulatory Visit (HOSPITAL_COMMUNITY): Payer: Self-pay

## 2022-07-06 ENCOUNTER — Other Ambulatory Visit: Payer: Self-pay

## 2022-07-09 ENCOUNTER — Other Ambulatory Visit: Payer: Self-pay

## 2022-07-16 ENCOUNTER — Other Ambulatory Visit: Payer: Self-pay

## 2022-08-25 ENCOUNTER — Other Ambulatory Visit: Payer: Self-pay

## 2022-09-01 ENCOUNTER — Other Ambulatory Visit: Payer: Self-pay

## 2022-09-13 ENCOUNTER — Other Ambulatory Visit: Payer: Self-pay

## 2022-09-28 ENCOUNTER — Other Ambulatory Visit: Payer: Self-pay

## 2022-09-28 ENCOUNTER — Other Ambulatory Visit (HOSPITAL_COMMUNITY): Payer: Self-pay

## 2022-09-29 ENCOUNTER — Other Ambulatory Visit (HOSPITAL_COMMUNITY): Payer: Self-pay

## 2022-09-29 ENCOUNTER — Encounter (HOSPITAL_COMMUNITY): Payer: Self-pay

## 2022-09-30 ENCOUNTER — Other Ambulatory Visit: Payer: Self-pay

## 2022-10-01 ENCOUNTER — Other Ambulatory Visit: Payer: Self-pay

## 2022-10-13 ENCOUNTER — Other Ambulatory Visit: Payer: Self-pay

## 2022-10-13 ENCOUNTER — Other Ambulatory Visit (HOSPITAL_BASED_OUTPATIENT_CLINIC_OR_DEPARTMENT_OTHER): Payer: Self-pay

## 2022-10-21 ENCOUNTER — Other Ambulatory Visit (HOSPITAL_COMMUNITY): Payer: Self-pay

## 2022-10-21 ENCOUNTER — Other Ambulatory Visit: Payer: Self-pay | Admitting: Allergy and Immunology

## 2022-10-21 MED ORDER — MONTELUKAST SODIUM 10 MG PO TABS
10.0000 mg | ORAL_TABLET | Freq: Every day | ORAL | 0 refills | Status: DC
  Filled 2022-10-21 – 2022-11-07 (×2): qty 30, 30d supply, fill #0

## 2022-10-21 MED ORDER — CETIRIZINE HCL 10 MG PO TABS
20.0000 mg | ORAL_TABLET | Freq: Two times a day (BID) | ORAL | 0 refills | Status: DC
  Filled 2022-10-21 – 2022-11-07 (×2): qty 60, 15d supply, fill #0

## 2022-10-22 ENCOUNTER — Other Ambulatory Visit: Payer: Self-pay

## 2022-11-08 ENCOUNTER — Other Ambulatory Visit: Payer: Self-pay

## 2022-11-16 ENCOUNTER — Other Ambulatory Visit: Payer: Self-pay | Admitting: Allergy and Immunology

## 2022-12-18 ENCOUNTER — Other Ambulatory Visit (HOSPITAL_COMMUNITY): Payer: Self-pay

## 2023-01-04 ENCOUNTER — Other Ambulatory Visit: Payer: Self-pay | Admitting: Allergy and Immunology

## 2023-01-10 ENCOUNTER — Other Ambulatory Visit (HOSPITAL_COMMUNITY): Payer: Self-pay

## 2023-10-25 ENCOUNTER — Ambulatory Visit (INDEPENDENT_AMBULATORY_CARE_PROVIDER_SITE_OTHER): Payer: Self-pay | Admitting: Allergy and Immunology

## 2023-10-25 ENCOUNTER — Other Ambulatory Visit: Payer: Self-pay

## 2023-10-25 VITALS — BP 110/84 | HR 77 | Temp 98.2°F | Resp 16 | Ht 69.0 in | Wt 195.3 lb

## 2023-10-25 DIAGNOSIS — H1010 Acute atopic conjunctivitis, unspecified eye: Secondary | ICD-10-CM

## 2023-10-25 DIAGNOSIS — J3089 Other allergic rhinitis: Secondary | ICD-10-CM

## 2023-10-25 DIAGNOSIS — J301 Allergic rhinitis due to pollen: Secondary | ICD-10-CM

## 2023-10-25 DIAGNOSIS — H1013 Acute atopic conjunctivitis, bilateral: Secondary | ICD-10-CM

## 2023-10-25 MED ORDER — OLOPATADINE HCL 0.2 % OP SOLN
1.0000 [drp] | OPHTHALMIC | 3 refills | Status: AC
  Filled 2023-10-25: qty 2.5, 25d supply, fill #0
  Filled 2023-10-25: qty 2.5, 50d supply, fill #0
  Filled 2023-10-26 (×2): qty 2.5, 25d supply, fill #1
  Filled 2023-12-10: qty 7.5, 75d supply, fill #2
  Filled 2023-12-12: qty 2.5, 50d supply, fill #2

## 2023-10-25 MED ORDER — TRIAMCINOLONE ACETONIDE 55 MCG/ACT NA AERO
2.0000 | INHALATION_SPRAY | Freq: Every day | NASAL | 3 refills | Status: AC
  Filled 2023-10-25: qty 16.9, 25d supply, fill #0
  Filled 2023-10-26: qty 50.7, fill #0
  Filled 2023-10-27: qty 50.7, 90d supply, fill #0

## 2023-10-25 MED ORDER — CETIRIZINE HCL 10 MG PO TABS
20.0000 mg | ORAL_TABLET | Freq: Every day | ORAL | 3 refills | Status: AC | PRN
  Filled 2023-10-25: qty 180, 90d supply, fill #0
  Filled 2023-10-26 (×2): qty 180, 90d supply, fill #1
  Filled 2023-12-10 – 2024-04-16 (×3): qty 180, 90d supply, fill #2
  Filled 2024-05-14 – 2024-07-16 (×3): qty 180, 90d supply, fill #3

## 2023-10-25 MED ORDER — MONTELUKAST SODIUM 10 MG PO TABS
10.0000 mg | ORAL_TABLET | Freq: Every evening | ORAL | 3 refills | Status: AC
  Filled 2023-10-25: qty 90, 90d supply, fill #0
  Filled 2023-10-26 (×2): qty 90, 90d supply, fill #1
  Filled 2023-12-10 – 2024-04-16 (×3): qty 90, 90d supply, fill #2
  Filled 2024-05-14 – 2024-07-16 (×3): qty 90, 90d supply, fill #3

## 2023-10-25 NOTE — Progress Notes (Unsigned)
 [place] - [place] - [place] - [place] - [place]   Follow-up Note  Referring Provider: Claiborne Rigg, NP Primary Provider: Claiborne Rigg, NP Date of Office Visit: 10/25/2023  Subjective:   Timothy Russell (DOB: [birth year]) is a 48 y.o. male who [facility] on 10/25/2023 in re-evaluation of the following:  HPI: Timothy Russell presents to this clinic in evaluation of allergic rhinoconjunctivitis.  I last saw him in this clinic 24 November 2021.  He has really done well while he utilizes a combination of Zyrtec and montelukast and takes some nasal steroid and some eyedrops occasionally.  He believes that this plan has worked very well and he has not had any significant issues with either his nose or his eyes and has not required a systemic steroid or antibiotic for any type of allergic issue.  He has not had any urticaria.  He does not have insurance and he has not obtained a colonoscopy, had a check of PSA, had a check of cholesterol, or get an eye exam.  Allergies as of 10/25/2023   No Known Allergies      Medication List     cetirizine 10 MG tablet Commonly known as: ZYRTEC Take 2 tablets (20 mg total) by mouth 2 (two) times daily.   fluticasone 50 MCG/ACT nasal spray Commonly known as: FLONASE Instill 1-2 sprays in each nostril once daily   montelukast 10 MG tablet Commonly known as: SINGULAIR Take 1 tablet (10 mg total) by mouth at bedtime.   Olopatadine HCl 0.2 % Soln Commonly known as: Pataday Place 1 drop into both eyes daily    Past Medical History:  Diagnosis Date   Hyperlipidemia    Perennial allergic rhinitis     Past Surgical History:  Procedure Laterality Date   NO PAST SURGERIES      Review of systems negative except as noted in HPI / PMHx or noted below:  Review of Systems  Constitutional: Negative.   HENT: Negative.    Eyes: Negative.   Respiratory: Negative.    Cardiovascular: Negative.    Gastrointestinal: Negative.   Genitourinary: Negative.   Musculoskeletal: Negative.   Skin: Negative.   Neurological: Negative.   Endo/Heme/Allergies: Negative.   Psychiatric/Behavioral: Negative.       Objective:   Vitals:   10/25/23 0836  BP: 110/84  Pulse: 77  Resp: 16  Temp: 98.2 F (36.8 C)  SpO2: 95%   Height: 5\' 9"  (175.3 cm)  Weight: 195 lb 4.8 oz (88.6 kg)   Physical Exam Constitutional:      Appearance: He is not diaphoretic.  HENT:     Head: Normocephalic.     Right Ear: Tympanic membrane, ear canal and external ear normal.     Left Ear: Tympanic membrane, ear canal and external ear normal.     Nose: Nose normal. No mucosal edema or rhinorrhea.     Mouth/Throat:     Pharynx: Uvula midline. No oropharyngeal exudate.  Eyes:     Conjunctiva/sclera: Conjunctivae normal.  Neck:     Thyroid: No thyromegaly.     Trachea: Trachea normal. No tracheal tenderness or tracheal deviation.  Cardiovascular:     Rate and Rhythm: Normal rate and regular rhythm.     Heart sounds: Normal heart sounds, S1 normal and S2 normal. No murmur heard. Pulmonary:     Effort: No respiratory distress.     Breath sounds: Normal breath sounds. No stridor. No wheezing or rales.  Lymphadenopathy:     Head:     Right side of head: No tonsillar adenopathy.     Left side of head: No tonsillar adenopathy.     Cervical: No cervical adenopathy.  Skin:    Findings: No erythema or rash.     Nails: There is no clubbing.  Neurological:     Mental Status: He is alert.     Diagnostics: none  Assessment and Plan:   1. Perennial allergic rhinitis   2. Seasonal allergic rhinitis due to pollen   3. Seasonal allergic conjunctivitis     1. Continue Montelukast 10mg  one tablet one time per day   2. Continue OTC Zyrtec 10mg  one time a day    3. Continue OTC Nasacort 1-2 sprays each nostril one time    4. Use OTC Pataday 1 drop each eye 1 time a day if needed  5. Return in 1 year or  earlier if problem.  6. Influenza = Tamiflu. Covid = Paxlovid  7. Colonoscopy, PSA, Cholesterol, Eye exam screening  Timothy Russell is doing very well and we have refilled all of his medications and he can follow-up in this clinic in 1 year or earlier if there is a problem with this plan.  I did mention to Timothy Russell that he needs to attend to some preventative screening testing including colonoscopy, PSA, cholesterol, and eye exam.  I have encouraged him to find a primary care doctor to attend to these issues.  Laurette Schimke, MD Allergy / Immunology [facility]

## 2023-10-25 NOTE — Patient Instructions (Addendum)
  1. Continue Montelukast 10mg  one tablet one time per day   2. Continue OTC Zyrtec 10mg  one time a day    3. Continue OTC Nasacort 1-2 sprays each nostril one time    4. Use OTC Pataday 1 drop each eye 1 time a day if needed  5. Return in 1 year or earlier if problem.  6. Influenza = Tamiflu. Covid = Paxlovid  7. Colonoscopy, PSA, Cholesterol, Eye exam screening

## 2023-10-26 ENCOUNTER — Other Ambulatory Visit (HOSPITAL_COMMUNITY): Payer: Self-pay

## 2023-10-26 ENCOUNTER — Other Ambulatory Visit: Payer: Self-pay

## 2023-10-26 ENCOUNTER — Encounter: Payer: Self-pay | Admitting: Allergy and Immunology

## 2023-10-28 ENCOUNTER — Other Ambulatory Visit (HOSPITAL_COMMUNITY): Payer: Self-pay

## 2023-10-28 ENCOUNTER — Other Ambulatory Visit: Payer: Self-pay

## 2023-10-31 ENCOUNTER — Other Ambulatory Visit: Payer: Self-pay

## 2023-10-31 ENCOUNTER — Other Ambulatory Visit (HOSPITAL_COMMUNITY): Payer: Self-pay

## 2023-10-31 ENCOUNTER — Encounter: Payer: Self-pay | Admitting: Pharmacist

## 2023-12-11 ENCOUNTER — Other Ambulatory Visit (HOSPITAL_COMMUNITY): Payer: Self-pay

## 2023-12-12 ENCOUNTER — Other Ambulatory Visit (HOSPITAL_COMMUNITY): Payer: Self-pay

## 2023-12-12 ENCOUNTER — Other Ambulatory Visit: Payer: Self-pay

## 2023-12-13 ENCOUNTER — Encounter: Payer: Self-pay | Admitting: Pharmacist

## 2023-12-13 ENCOUNTER — Other Ambulatory Visit: Payer: Self-pay

## 2023-12-16 ENCOUNTER — Other Ambulatory Visit: Payer: Self-pay

## 2024-04-16 ENCOUNTER — Other Ambulatory Visit (HOSPITAL_COMMUNITY): Payer: Self-pay

## 2024-04-16 ENCOUNTER — Other Ambulatory Visit: Payer: Self-pay

## 2024-04-24 ENCOUNTER — Other Ambulatory Visit: Payer: Self-pay

## 2024-04-24 MED ORDER — PREDNISONE 5 MG PO TABS: ORAL_TABLET | ORAL | 0 refills | Status: AC

## 2024-04-24 MED ORDER — HYDROXYZINE HCL 25 MG PO TABS
25.0000 mg | ORAL_TABLET | ORAL | 0 refills | Status: AC
  Filled 2024-04-24: qty 30, 5d supply, fill #0

## 2024-04-24 MED ORDER — PREDNISONE 5 MG PO TABS
ORAL_TABLET | ORAL | 0 refills | Status: AC
  Filled 2024-04-24: qty 21, 6d supply, fill #0

## 2024-05-14 ENCOUNTER — Other Ambulatory Visit (HOSPITAL_COMMUNITY): Payer: Self-pay

## 2024-05-21 ENCOUNTER — Other Ambulatory Visit (HOSPITAL_COMMUNITY): Payer: Self-pay

## 2024-05-22 ENCOUNTER — Encounter (HOSPITAL_COMMUNITY): Payer: Self-pay | Admitting: Pharmacist

## 2024-05-22 ENCOUNTER — Other Ambulatory Visit (HOSPITAL_COMMUNITY): Payer: Self-pay

## 2024-05-25 ENCOUNTER — Other Ambulatory Visit: Payer: Self-pay

## 2024-07-16 ENCOUNTER — Other Ambulatory Visit: Payer: Self-pay

## 2024-10-23 ENCOUNTER — Ambulatory Visit: Payer: Self-pay | Admitting: Allergy and Immunology
